# Patient Record
Sex: Female | Born: 1950 | Race: White | Hispanic: No | Marital: Married | Smoking: Never smoker
Health system: Southern US, Community
[De-identification: ages and names within clinical notes are randomized; demographics above are authoritative.]

## PROBLEM LIST (undated history)

## (undated) DIAGNOSIS — G35 Multiple sclerosis: Secondary | ICD-10-CM

## (undated) DIAGNOSIS — D332 Benign neoplasm of brain, unspecified: Secondary | ICD-10-CM

## (undated) DIAGNOSIS — H539 Unspecified visual disturbance: Secondary | ICD-10-CM

## (undated) DIAGNOSIS — I10 Essential (primary) hypertension: Secondary | ICD-10-CM

## (undated) DIAGNOSIS — H9190 Unspecified hearing loss, unspecified ear: Secondary | ICD-10-CM

## (undated) DIAGNOSIS — J329 Chronic sinusitis, unspecified: Secondary | ICD-10-CM

## (undated) DIAGNOSIS — I7300 Raynaud's syndrome without gangrene: Secondary | ICD-10-CM

## (undated) HISTORY — DX: Unspecified hearing loss, unspecified ear: H91.90

## (undated) HISTORY — DX: Unspecified visual disturbance: H53.9

## (undated) HISTORY — PX: BRAIN TUMOR EXCISION: SHX577

## (undated) HISTORY — DX: Multiple sclerosis: G35

## (undated) HISTORY — PX: OTHER SURGICAL HISTORY: SHX169

## (undated) HISTORY — PX: NASAL SINUS SURGERY: SHX719

## (undated) HISTORY — DX: Essential (primary) hypertension: I10

---

## 1998-08-20 ENCOUNTER — Ambulatory Visit (HOSPITAL_COMMUNITY): Admission: RE | Admit: 1998-08-20 | Discharge: 1998-08-20 | Payer: Self-pay | Admitting: Internal Medicine

## 1998-08-20 ENCOUNTER — Encounter: Payer: Self-pay | Admitting: Internal Medicine

## 1998-10-07 ENCOUNTER — Ambulatory Visit: Admission: RE | Admit: 1998-10-07 | Discharge: 1998-10-07 | Payer: Self-pay | Admitting: Internal Medicine

## 1999-06-29 ENCOUNTER — Other Ambulatory Visit: Admission: RE | Admit: 1999-06-29 | Discharge: 1999-06-29 | Payer: Self-pay | Admitting: Internal Medicine

## 2000-05-05 ENCOUNTER — Other Ambulatory Visit: Admission: RE | Admit: 2000-05-05 | Discharge: 2000-05-05 | Payer: Self-pay | Admitting: Internal Medicine

## 2000-06-08 ENCOUNTER — Other Ambulatory Visit: Admission: RE | Admit: 2000-06-08 | Discharge: 2000-06-08 | Payer: Self-pay | Admitting: Internal Medicine

## 2000-12-07 ENCOUNTER — Encounter: Payer: Self-pay | Admitting: Internal Medicine

## 2000-12-07 ENCOUNTER — Encounter: Admission: RE | Admit: 2000-12-07 | Discharge: 2000-12-07 | Payer: Self-pay | Admitting: Internal Medicine

## 2001-05-25 ENCOUNTER — Other Ambulatory Visit: Admission: RE | Admit: 2001-05-25 | Discharge: 2001-05-25 | Payer: Self-pay | Admitting: Internal Medicine

## 2001-06-07 ENCOUNTER — Encounter: Payer: Self-pay | Admitting: Internal Medicine

## 2001-06-07 ENCOUNTER — Encounter: Admission: RE | Admit: 2001-06-07 | Discharge: 2001-06-07 | Payer: Self-pay | Admitting: Internal Medicine

## 2001-06-22 ENCOUNTER — Encounter: Admission: RE | Admit: 2001-06-22 | Discharge: 2001-06-22 | Payer: Self-pay | Admitting: Internal Medicine

## 2001-06-22 ENCOUNTER — Encounter: Payer: Self-pay | Admitting: Internal Medicine

## 2002-05-31 ENCOUNTER — Other Ambulatory Visit: Admission: RE | Admit: 2002-05-31 | Discharge: 2002-05-31 | Payer: Self-pay | Admitting: Internal Medicine

## 2003-06-21 ENCOUNTER — Other Ambulatory Visit: Admission: RE | Admit: 2003-06-21 | Discharge: 2003-06-21 | Payer: Self-pay | Admitting: Internal Medicine

## 2004-02-28 ENCOUNTER — Encounter: Admission: RE | Admit: 2004-02-28 | Discharge: 2004-02-28 | Payer: Self-pay | Admitting: Internal Medicine

## 2004-03-03 ENCOUNTER — Ambulatory Visit (HOSPITAL_COMMUNITY): Admission: RE | Admit: 2004-03-03 | Discharge: 2004-03-03 | Payer: Self-pay | Admitting: Internal Medicine

## 2005-06-16 IMAGING — CT CT ABDOMEN W/ CM
1 series · 15 of 32 positions shown, 19 images · IV contrast (agent unspecified)
Comparison: none

CLINICAL DATA: Right lower quadrant pain for three weeks. Nausea. 
 ABDOMINAL CT WITH CONTRAST

[Series 2: routine abdomen · axial · 0.70mm/px · z∈[-377,+3]mm · 15 of 112 slices shown, 19 images]
[im 8/112  soft-tissue]
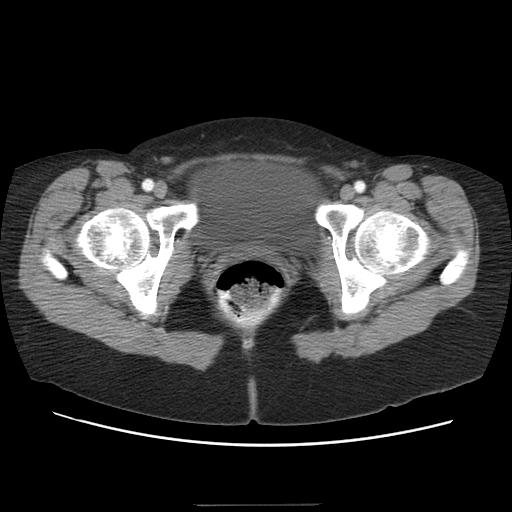
[im 8/112  bone]
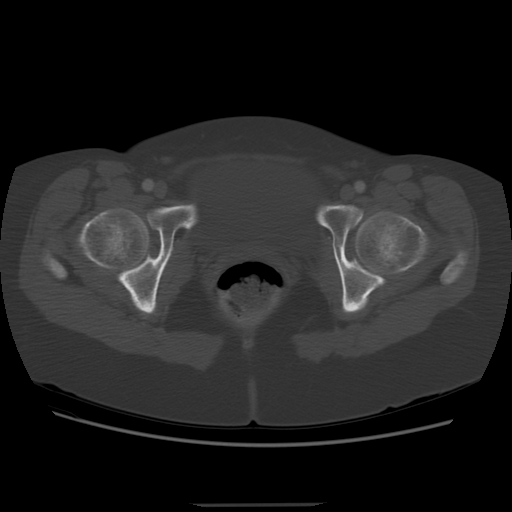
[im 15/112  soft-tissue]
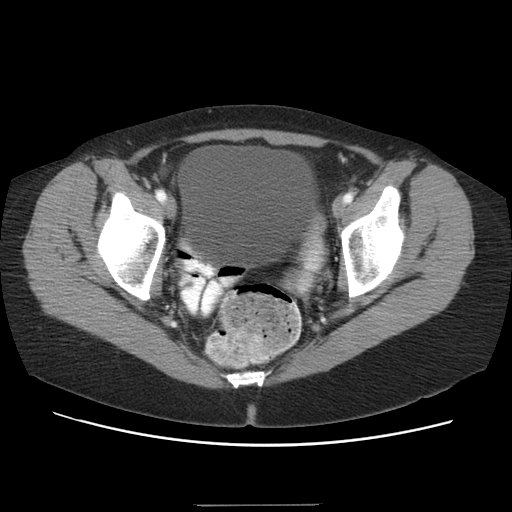
[im 22/112  soft-tissue]
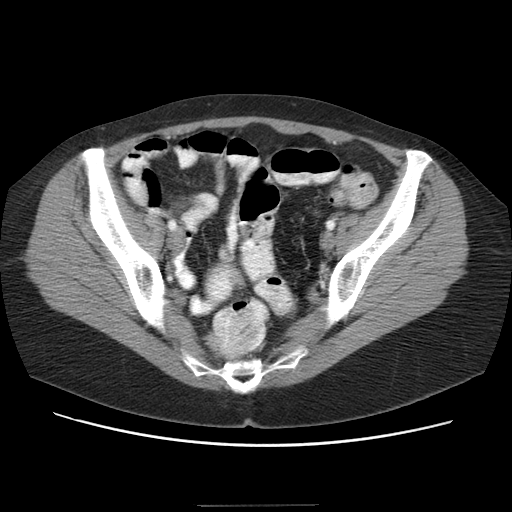
[im 33/112  soft-tissue]
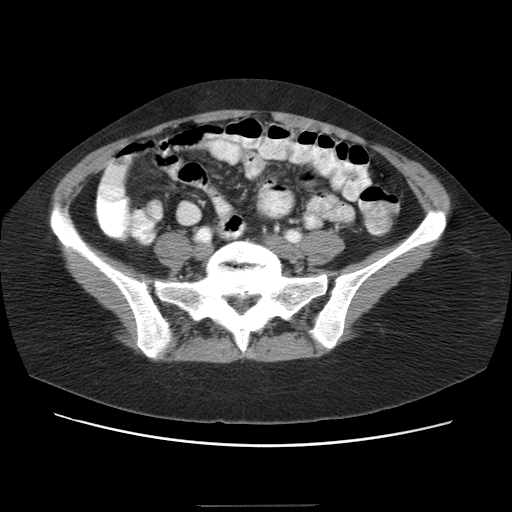
[im 40/112  soft-tissue]
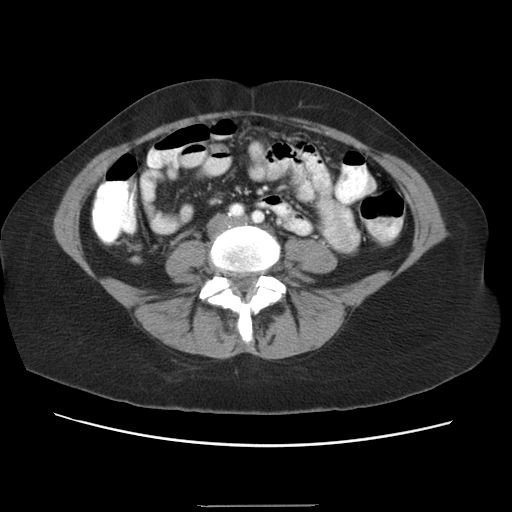
[im 47/112  soft-tissue]
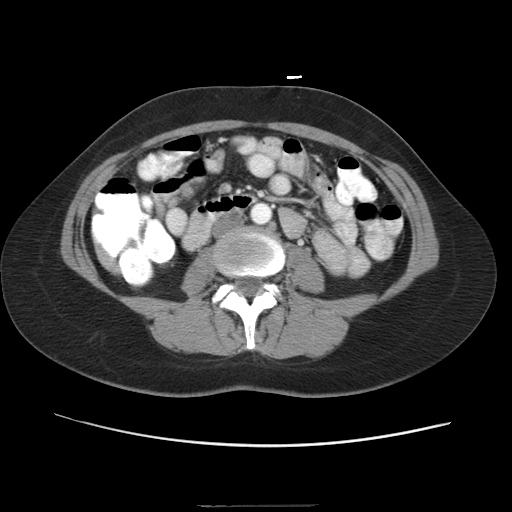
[im 58/112  soft-tissue]
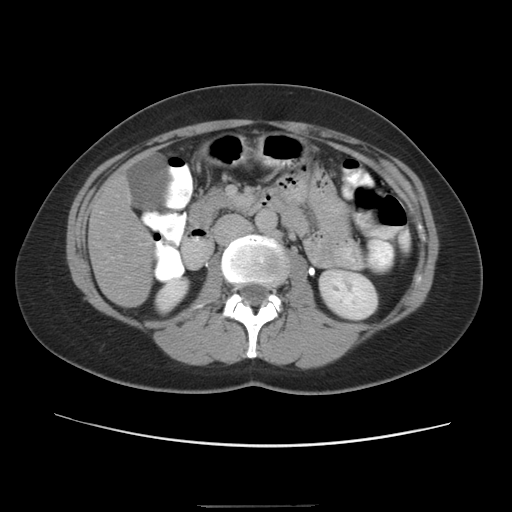
[im 65/112  soft-tissue]
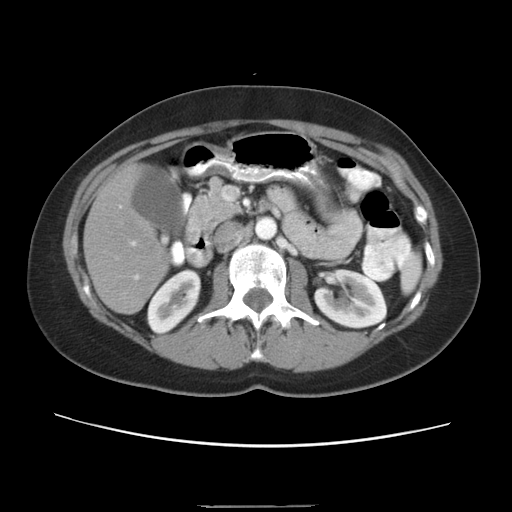
[im 72/112  soft-tissue]
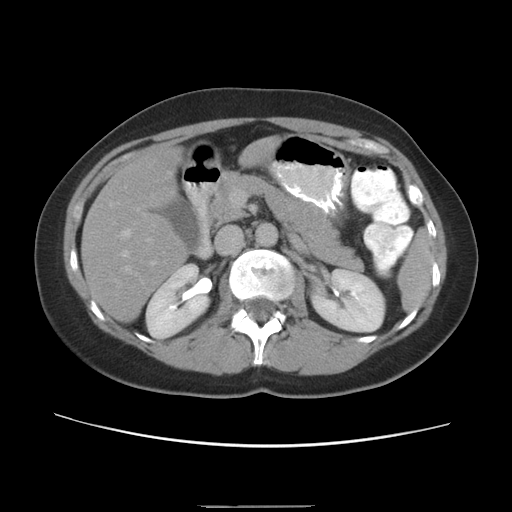
[im 72/112  bone]
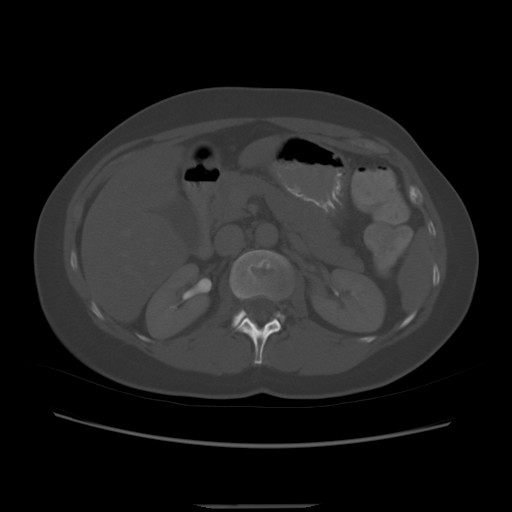
[im 79/112  soft-tissue]
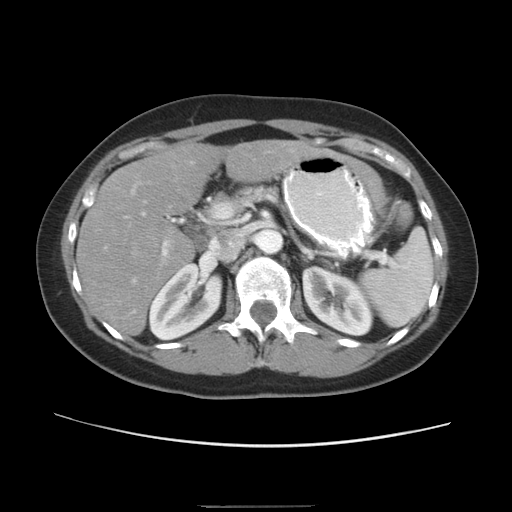
[im 90/112  soft-tissue]
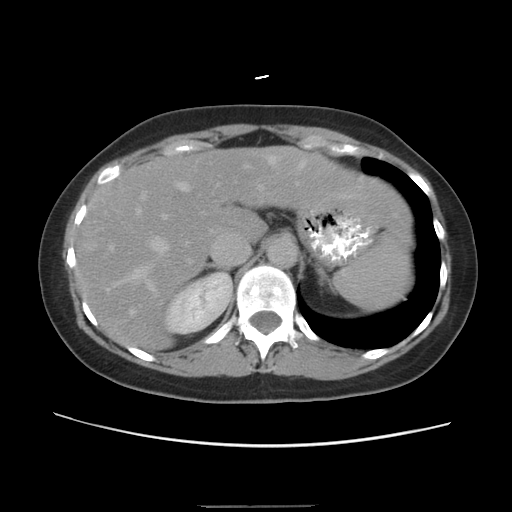
[im 97/112  soft-tissue]
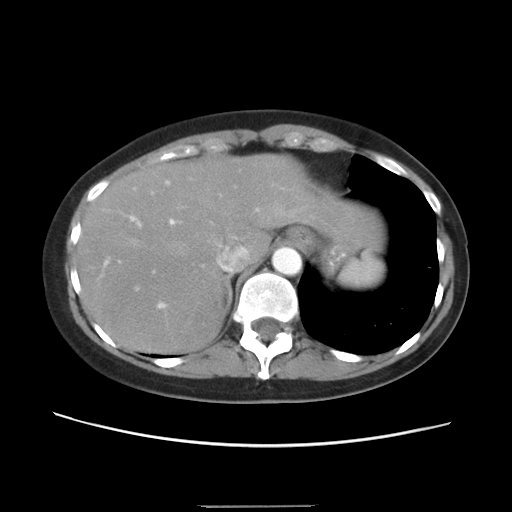
[im 97/112  lung]
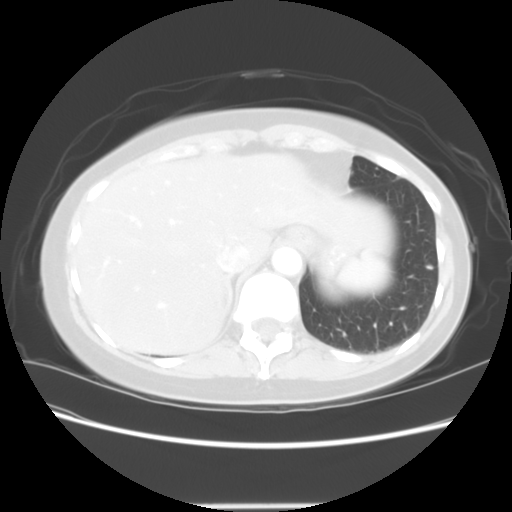
[im 101/112  lung]
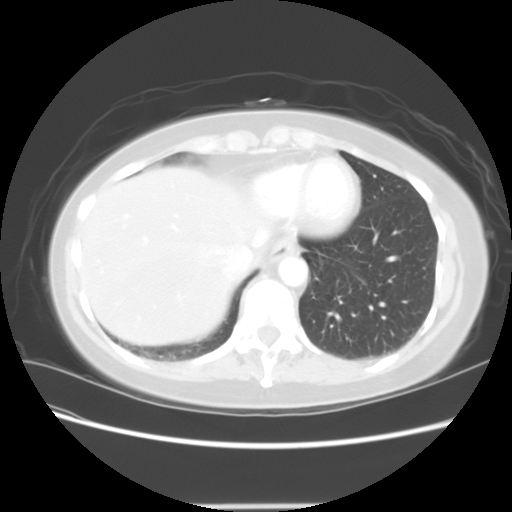
[im 104/112  soft-tissue]
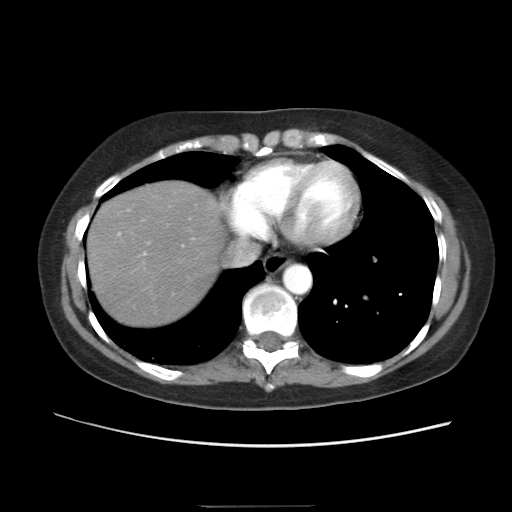
[im 104/112  lung]
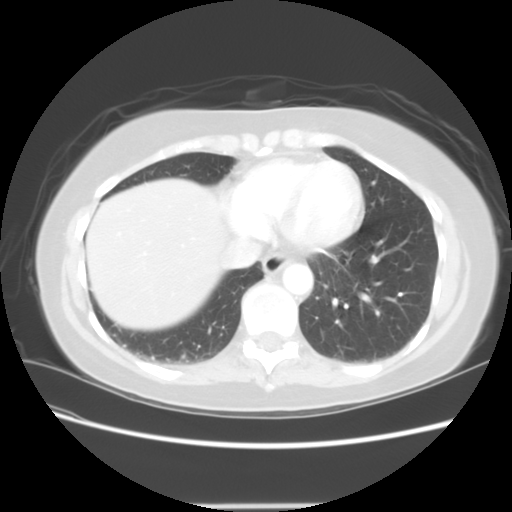
[im 108/112  lung]
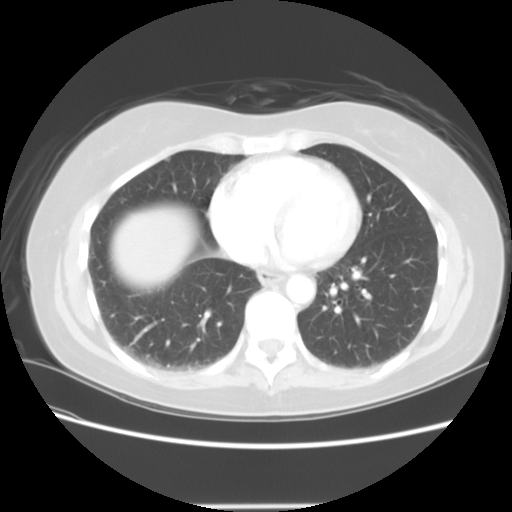

[15 of 32 positions shown; findings below may reference images not displayed]

Routine spiral CT of the abdomen was performed.   100 cc Omnipaque 300 intravenous contrast and oral contrast were administered.

 The abdominal parenchymal organs are normal in appearance. There is no evidence of masses, adenopathy, inflammatory process, or abnormal fluid collections.  Moderate degenerative disc disease is seen at L5-S1.  Slight diffuse fatty infiltration of the liver.   Anatomic variant left retroaortic renal vein is seen.  The retrocecal appendix appears normal in caliber with no periappendiceal inflammation measuring 5 mm (image 48).

 IMPRESSION
 1.  Moderate degenerative disc disease L5-S1.
 2.  Slight diffuse fatty infiltration of the liver. 
 3.  Otherwise negative.

 PELVIS CT WITH CONTRAST
 Routine spiral CT of the pelvis was performed. Omnipaque intravenous contrast and oral contrast were administered. 

 The pelvic structures are normal in appearance. There is no evidence of masses, adenopathy, inflammatory process, or abnormal fluid collections.   Patient is post-hysterectomy with left oophorectomy for endometriosis.

 IMPRESSION
 Normal postoperative study.

## 2005-07-26 ENCOUNTER — Other Ambulatory Visit: Admission: RE | Admit: 2005-07-26 | Discharge: 2005-07-26 | Payer: Self-pay | Admitting: Internal Medicine

## 2005-10-14 ENCOUNTER — Ambulatory Visit (HOSPITAL_COMMUNITY): Admission: RE | Admit: 2005-10-14 | Discharge: 2005-10-14 | Payer: Self-pay | Admitting: Internal Medicine

## 2010-09-20 ENCOUNTER — Encounter: Payer: Self-pay | Admitting: Internal Medicine

## 2011-06-02 DIAGNOSIS — G35 Multiple sclerosis: Secondary | ICD-10-CM | POA: Insufficient documentation

## 2011-06-02 DIAGNOSIS — H918X9 Other specified hearing loss, unspecified ear: Secondary | ICD-10-CM | POA: Insufficient documentation

## 2011-06-02 DIAGNOSIS — H9319 Tinnitus, unspecified ear: Secondary | ICD-10-CM | POA: Insufficient documentation

## 2011-07-19 DIAGNOSIS — R231 Pallor: Secondary | ICD-10-CM | POA: Insufficient documentation

## 2011-07-19 DIAGNOSIS — D1801 Hemangioma of skin and subcutaneous tissue: Secondary | ICD-10-CM | POA: Insufficient documentation

## 2011-07-19 DIAGNOSIS — D229 Melanocytic nevi, unspecified: Secondary | ICD-10-CM | POA: Insufficient documentation

## 2011-10-11 DIAGNOSIS — I10 Essential (primary) hypertension: Secondary | ICD-10-CM | POA: Insufficient documentation

## 2011-10-11 DIAGNOSIS — R079 Chest pain, unspecified: Secondary | ICD-10-CM | POA: Insufficient documentation

## 2011-10-11 DIAGNOSIS — R1900 Intra-abdominal and pelvic swelling, mass and lump, unspecified site: Secondary | ICD-10-CM | POA: Insufficient documentation

## 2012-03-14 DIAGNOSIS — J0130 Acute sphenoidal sinusitis, unspecified: Secondary | ICD-10-CM | POA: Insufficient documentation

## 2013-06-18 ENCOUNTER — Telehealth: Payer: Self-pay | Admitting: Hematology & Oncology

## 2013-06-26 NOTE — Telephone Encounter (Signed)
na

## 2014-10-11 ENCOUNTER — Other Ambulatory Visit: Payer: Self-pay | Admitting: Neurology

## 2014-10-11 NOTE — Telephone Encounter (Signed)
Patient has not been seen at [facility].  Per Faith, we cannot refill meds until patient is seen here.

## 2014-10-20 ENCOUNTER — Encounter: Payer: Self-pay | Admitting: Neurology

## 2014-10-20 DIAGNOSIS — D329 Benign neoplasm of meninges, unspecified: Secondary | ICD-10-CM | POA: Insufficient documentation

## 2014-10-20 DIAGNOSIS — F418 Other specified anxiety disorders: Secondary | ICD-10-CM | POA: Insufficient documentation

## 2014-10-20 DIAGNOSIS — R260 Ataxic gait: Secondary | ICD-10-CM | POA: Insufficient documentation

## 2014-10-20 DIAGNOSIS — R413 Other amnesia: Secondary | ICD-10-CM | POA: Insufficient documentation

## 2014-10-20 DIAGNOSIS — R5383 Other fatigue: Secondary | ICD-10-CM | POA: Insufficient documentation

## 2014-10-20 DIAGNOSIS — R3911 Hesitancy of micturition: Secondary | ICD-10-CM | POA: Insufficient documentation

## 2014-10-20 DIAGNOSIS — G35A Relapsing-remitting multiple sclerosis: Secondary | ICD-10-CM | POA: Insufficient documentation

## 2014-10-20 DIAGNOSIS — G35 Multiple sclerosis: Secondary | ICD-10-CM | POA: Insufficient documentation

## 2014-10-21 ENCOUNTER — Encounter: Payer: Self-pay | Admitting: Neurology

## 2014-10-21 ENCOUNTER — Ambulatory Visit (INDEPENDENT_AMBULATORY_CARE_PROVIDER_SITE_OTHER): Payer: BLUE CROSS/BLUE SHIELD | Admitting: Neurology

## 2014-10-21 VITALS — BP 124/68 | HR 70 | Resp 12 | Ht 66.0 in | Wt 141.0 lb

## 2014-10-21 DIAGNOSIS — I7300 Raynaud's syndrome without gangrene: Secondary | ICD-10-CM | POA: Insufficient documentation

## 2014-10-21 DIAGNOSIS — R260 Ataxic gait: Secondary | ICD-10-CM

## 2014-10-21 DIAGNOSIS — G35 Multiple sclerosis: Secondary | ICD-10-CM | POA: Insufficient documentation

## 2014-10-21 DIAGNOSIS — F418 Other specified anxiety disorders: Secondary | ICD-10-CM

## 2014-10-21 DIAGNOSIS — D329 Benign neoplasm of meninges, unspecified: Secondary | ICD-10-CM

## 2014-10-21 DIAGNOSIS — I10 Essential (primary) hypertension: Secondary | ICD-10-CM | POA: Insufficient documentation

## 2014-10-21 DIAGNOSIS — R5383 Other fatigue: Secondary | ICD-10-CM | POA: Diagnosis not present

## 2014-10-21 DIAGNOSIS — J329 Chronic sinusitis, unspecified: Secondary | ICD-10-CM | POA: Insufficient documentation

## 2014-10-21 DIAGNOSIS — J45909 Unspecified asthma, uncomplicated: Secondary | ICD-10-CM | POA: Insufficient documentation

## 2014-10-21 DIAGNOSIS — K760 Fatty (change of) liver, not elsewhere classified: Secondary | ICD-10-CM | POA: Insufficient documentation

## 2014-10-21 DIAGNOSIS — M8580 Other specified disorders of bone density and structure, unspecified site: Secondary | ICD-10-CM | POA: Insufficient documentation

## 2014-10-21 DIAGNOSIS — R413 Other amnesia: Secondary | ICD-10-CM

## 2014-10-21 MED ORDER — BACLOFEN 20 MG PO TABS: 20.0000 mg | ORAL_TABLET | Freq: Two times a day (BID) | ORAL | Status: DC

## 2014-10-21 MED ORDER — CARISOPRODOL 350 MG PO TABS: 350.0000 mg | ORAL_TABLET | Freq: Every day | ORAL | Status: DC | PRN

## 2014-10-21 MED ORDER — BUPROPION HCL ER (XL) 150 MG PO TB24: 150.0000 mg | ORAL_TABLET | Freq: Every day | ORAL | Status: DC

## 2014-10-21 MED ORDER — ALPRAZOLAM 0.5 MG PO TABS: 0.5000 mg | ORAL_TABLET | Freq: Every evening | ORAL | Status: DC | PRN

## 2014-10-21 MED ORDER — GABAPENTIN 600 MG PO TABS: 600.0000 mg | ORAL_TABLET | Freq: Three times a day (TID) | ORAL | Status: DC

## 2014-10-21 NOTE — Progress Notes (Signed)
[facility]  PATIENT: Dorothy Meyer DOB: 1951/02/08  REFERRING DOCTOR OR PCP:  Scarlette Slice  SOURCE: Patient  _________________________________   HISTORICAL  CHIEF COMPLAINT:  Chief Complaint  Patient presents with  . Multiple Sclerosis    Sts. she tolerates Rebif well.  Denies new or worsening sx./fim    HISTORY OF PRESENT ILLNESS:  Dorothy Meyer is a 64 year old woman who was diagnosed with multiple sclerosis in the 1997.     She presented with numbness in both arms. An MRI of the cervical spine showed a plaque at C2C3.   An MRI of the brain at that time showed other lesions, compatible with the diagnosis of multiple sclerosis. She had only 3 plaques in the brain initially and treatment was deferred. However, one year later she had optic neuritis, confirmed the diagnosis of multiple sclerosis. Initially, she was placed on Avonex. She then tried Copaxonelater on as she was having flulike symptoms with Avonex. However, she had severe skin reactions with Copaxone that was stopped. She then went on Rebif. She has been on Rebif for the past 8 years or so and tolerates it very well. Her MS has been mostly stable while on the Rebif. However, she has had a couple episodes of mild neurologic symptoms that may have represented small exacerbations that were treated with IV steroids.   The last episode was 08/01/2014 for eye pain and visual blurring.   He received 2 days of IV Solu-Medrol with benefit. Further symptoms since then.   Her Rebif dose is 22 g. She tolerates that dose well. Her last MRI of the brain was in 2015 and did not show any new lesions.  I personally reviewed her last brain and cervical spine MRI. On the brain MRI she has multiple lesions consistent with MS in the periventricular, deep and subcortical white matter. 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 On the cervical spine MRI she has 3 hyperintense  foci consistent with demyelinating plaque.  She has many symptoms related to her MS including poor gait, history of optic neuritis, urinary hesitancy, fatigue, mood dysfunction and cognitive concerns.  Gait:  She notes a little weakness in the right leg. This leads to a right foot drop which affects her gait. She does not need to use a cane. This is been stable for many years without clear worsening since I started to see her.  Vision:   In the past, she has had right optic neuritis. This was proceeded by a fair amount of pain that eye. She has had a couple episodes of eye pain that improved with IV steroids. She has not had any definite optic neuritis, however since the initial episode.  No diplopia  Bladder:   She denies any significant urinary frequency, urgency or hesitancy.  Fatigue:  She notes physical more than cognitive fatigue. This does not occur every day. This  can be triggered by stress or by heat. She has tried several medications including Provigil and Ritalin in the past. However they did not help her fatigue significantly.  Mood dysfunction:   She currently feels that there is no significant depression or anxiety. She remains on Wellbutrin. She was having some irritability in the past but feels that this is better.  Cognitive dysfunction:  She notes some difficulty with focus, attention, verbal fluency and multitasking. She stopped working 2 years ago. She feels she is able to do better with the mild  cognitive dysfunction with current activities than when she was working.  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  She sees at [facility] for this.   REVIEW OF SYSTEMS: Constitutional: No fevers, chills, sweats, or change in appetite.   Fatigue is stable Eyes: No visual changes, double vision, eye pain Ear, nose and throat: No hearing loss, ear pain, nasal congestion, sore throat Cardiovascular: No chest  pain, palpitations Respiratory: No shortness of breath at rest or with exertion.   No wheezes GastrointestinaI: No nausea, vomiting, diarrhea, abdominal pain, fecal incontinence Genitourinary: No dysuria, urinary retention or frequency.  No nocturia. Musculoskeletal: No neck pain, back pain Integumentary: No rash, pruritus, skin lesions Neurological: as above Psychiatric: No current depression at this time.  No anxiety Endocrine: No palpitations, diaphoresis, change in appetite, change in weigh or increased thirst Hematologic/Lymphatic: No anemia, purpura, petechiae. Allergic/Immunologic: No itchy/runny eyes, nasal congestion, recent allergic reactions, rashes  ALLERGIES: Allergies  Allergen Reactions  . Hydrocodone-Acetaminophen Nausea And Vomiting  . Propoxyphene Nausea And Vomiting  . Tramadol Nausea And Vomiting    HOME MEDICATIONS:  Current outpatient prescriptions:  .  fluconazole (DIFLUCAN) 150 MG tablet, Take 1 tablet once; may repeat in 3 days if needed., Disp: , Rfl:  .  lisinopril (PRINIVIL,ZESTRIL) 10 MG tablet, TAKE 1 TABLET (10 MG TOTAL) BY MOUTH DAILY., Disp: , Rfl:  .  metroNIDAZOLE (METROGEL) 1 % gel, Apply topically., Disp: , Rfl:  .  albuterol (PROVENTIL HFA;VENTOLIN HFA) 108 (90 BASE) MCG/ACT inhaler, Inhale into the lungs., Disp: , Rfl:  .  ALPRAZolam (XANAX) 0.5 MG tablet, Take 0.5 mg by mouth., Disp: , Rfl:  .  baclofen (LIORESAL) 20 MG tablet, Take 20 mg by mouth., Disp: , Rfl:  .  budesonide (PULMICORT) 1 MG/2ML nebulizer solution, , Disp: , Rfl: 10 .  buPROPion (WELLBUTRIN SR) 150 MG 12 hr tablet, Take 150 mg by mouth., Disp: , Rfl:  .  calcium-vitamin D (OSCAL) 250-125 MG-UNIT per tablet, Take by mouth., Disp: , Rfl:  .  carisoprodol (SOMA) 350 MG tablet, Take 350 mg by mouth., Disp: , Rfl:  .  conjugated estrogens (PREMARIN) vaginal cream, Place vaginally., Disp: , Rfl:  .  gabapentin (NEURONTIN) 600 MG tablet, Take 600 mg by mouth., Disp: , Rfl:  .   interferon beta-1a (REBIF) 44 MCG/0.5ML SOSY injection, Inject 22 mcg into the skin., Disp: , Rfl:  .  Multiple Vitamin (THERA) TABS, Take 1 tablet by mouth., Disp: , Rfl:  .  Omega-3 Fatty Acids (FISH OIL) 500 MG CAPS, Take by mouth., Disp: , Rfl:   PAST MEDICAL HISTORY: Past Medical History  Diagnosis Date  . Hearing loss   . Hypertension   . Multiple sclerosis   . Vision abnormalities     PAST SURGICAL HISTORY: Past Surgical History  Procedure Laterality Date  . Brain tumor excision    . Hystrectomy      FAMILY HISTORY: Family History  Problem Relation Age of Onset  . Healthy Mother   . Lung cancer Father     SOCIAL HISTORY:  History   Social History  . Marital Status: Married    Spouse Name: N/A  . Number of Children: N/A  . Years of Education: N/A   Occupational History  . Not on file.   Social History Main Topics  . Smoking status: Never Smoker   . Smokeless tobacco: Not on file  . Alcohol Use:  No  . Drug Use: No  . Sexual Activity: Not on file   Other Topics Concern  . Not on file   Social History Narrative  . No narrative on file     PHYSICAL EXAM  Filed Vitals:   10/21/14 1554  BP: 124/68  Pulse: 70  Resp: 12  Height: 5\' 6"  (1.676 m)  Weight: 141 lb (63.957 kg)    Body mass index is 22.77 kg/(m^2).   General: The patient is well-developed and well-nourished and in no acute distress  Eyes:  Funduscopic exam shows normal optic discs and retinal vessels.  Neck: The neck is supple, no carotid bruits are noted.  The neck is nontender.  Cardiovascular: The heart has a regular rate and rhythm with a normal S1 and S2. There were no murmurs, gallops or rubs. Lungs are clear to auscultation.  Skin: Extremities are without significant edema.  Musculoskeletal:  Back is nontender  Neurologic Exam  Mental status: The patient is alert and oriented x 3 at the time of the examination. The patient has apparent normal recent and remote memory,  with an apparently normal attention span and concentration ability.   Speech is normal.  Cranial nerves: Extraocular movements are full. Pupils are equal, round, and reactive to light and accomodation.  Visual fields are full.  Facial symmetry is present. There is good facial sensation to soft touch bilaterally.Facial strength is normal.  Trapezius and sternocleidomastoid strength is normal. No dysarthria is noted.  The tongue is midline, and the patient has symmetric elevation of the soft palate. No obvious hearing deficits are noted.  Motor:  Muscle bulk is normal.   Tone is normal. Strength is  5 / 5 in all 4 extremities.   Sensory: Sensory testing is intact to pinprick, soft touch and vibration sensation in all 4 extremities.  Coordination: Cerebellar testing reveals good finger-nose-finger and slightly reduced heel-to-shin bilaterally.  Gait and station: Station is normal.   Gait is minimally wide with a mild right foot drop. Tandem gait is mildly wide. Romberg is negative.   Reflexes: Deep tendon reflexes are symmetric and normal bilaterally.   Plantar responses are flexor.    DIAGNOSTIC DATA (LABS, IMAGING, TESTING) - I reviewed patient records, labs, notes, testing and imaging myself where available.   About 1 pages of notes from [facility] neurology were reviewed including MRI reports and lab reports.       ASSESSMENT AND PLAN  Multiple sclerosis, relapsing-remitting  Other fatigue  Memory loss due to medical condition  Depression with anxiety  Ataxic gait  Meningioma   In summary, Dorothy Meyer is a 64 year old woman with a 19 year history of multiple sclerosis who is currently doing well on Rebif therapy. She continues to have some symptoms associated with her MS including a mildly reduced gait, fatigue and cognitive dysfunction. The fatigue and cognitive dysfunction have less effect on her life now that she is not working. I will refill her medications. She is advised  to continue to remain active  She will return to see me in 6 months or sooner if she has new or worsening neurologic symptoms.  40 minute face-to-face interaction with greater than 50% of the time counseling and correlating care about her multiple sclerosis and related symptoms.   Richard A. Felecia Shelling, MD, PhD 02/17/3085, 5:78 PM Certified in Neurology, Clinical Neurophysiology, Sleep Medicine, Pain Medicine and Neuroimaging  [facility] [address], [place], [place] [address] [phone]

## 2014-11-18 ENCOUNTER — Emergency Department (HOSPITAL_COMMUNITY)
Admission: EM | Admit: 2014-11-18 | Discharge: 2014-11-18 | Disposition: A | Discharge status: Home / Self Care | Payer: BLUE CROSS/BLUE SHIELD | Attending: Emergency Medicine | Admitting: Emergency Medicine

## 2014-11-18 ENCOUNTER — Emergency Department (HOSPITAL_COMMUNITY): Payer: BLUE CROSS/BLUE SHIELD

## 2014-11-18 ENCOUNTER — Encounter (HOSPITAL_COMMUNITY): Payer: Self-pay

## 2014-11-18 DIAGNOSIS — R51 Headache: Secondary | ICD-10-CM | POA: Diagnosis not present

## 2014-11-18 DIAGNOSIS — R42 Dizziness and giddiness: Secondary | ICD-10-CM | POA: Insufficient documentation

## 2014-11-18 DIAGNOSIS — Z8709 Personal history of other diseases of the respiratory system: Secondary | ICD-10-CM | POA: Insufficient documentation

## 2014-11-18 DIAGNOSIS — H9190 Unspecified hearing loss, unspecified ear: Secondary | ICD-10-CM | POA: Insufficient documentation

## 2014-11-18 DIAGNOSIS — I10 Essential (primary) hypertension: Secondary | ICD-10-CM | POA: Diagnosis not present

## 2014-11-18 DIAGNOSIS — Z79899 Other long term (current) drug therapy: Secondary | ICD-10-CM | POA: Insufficient documentation

## 2014-11-18 DIAGNOSIS — Z86011 Personal history of benign neoplasm of the brain: Secondary | ICD-10-CM | POA: Insufficient documentation

## 2014-11-18 HISTORY — DX: Raynaud's syndrome without gangrene: I73.00

## 2014-11-18 HISTORY — DX: Chronic sinusitis, unspecified: J32.9

## 2014-11-18 HISTORY — DX: Benign neoplasm of brain, unspecified: D33.2

## 2014-11-18 LAB — BASIC METABOLIC PANEL
ANION GAP: 12 (ref 5–15)
BUN: 14 mg/dL (ref 6–23)
CALCIUM: 9.6 mg/dL (ref 8.4–10.5)
CO2: 26 mmol/L (ref 19–32)
Chloride: 102 mmol/L (ref 96–112)
Creatinine, Ser: 0.78 mg/dL (ref 0.50–1.10)
GFR, EST NON AFRICAN AMERICAN: 87 mL/min — AB (ref >90)
GLUCOSE: 106 mg/dL — AB (ref 70–99)
POTASSIUM: 4 mmol/L (ref 3.5–5.1)
SODIUM: 140 mmol/L (ref 135–145)

## 2014-11-18 LAB — CBC WITH DIFFERENTIAL/PLATELET
Basophils Absolute: 0 10*3/uL (ref 0.0–0.1)
Basophils Relative: 0 % (ref 0–1)
EOS ABS: 0.1 10*3/uL (ref 0.0–0.7)
EOS PCT: 1 % (ref 0–5)
HEMATOCRIT: 43.8 % (ref 36.0–46.0)
Hemoglobin: 14.1 g/dL (ref 12.0–15.0)
LYMPHS ABS: 1.1 10*3/uL (ref 0.7–4.0)
LYMPHS PCT: 12 % (ref 12–46)
MCH: 30.5 pg (ref 26.0–34.0)
MCHC: 32.2 g/dL (ref 30.0–36.0)
MCV: 94.6 fL (ref 78.0–100.0)
MONO ABS: 0.4 10*3/uL (ref 0.1–1.0)
Monocytes Relative: 5 % (ref 3–12)
Neutro Abs: 7.6 10*3/uL (ref 1.7–7.7)
Neutrophils Relative %: 82 % — ABNORMAL HIGH (ref 43–77)
PLATELETS: 268 10*3/uL (ref 150–400)
RBC: 4.63 MIL/uL (ref 3.87–5.11)
RDW: 13.1 % (ref 11.5–15.5)
WBC: 9.2 10*3/uL (ref 4.0–10.5)

## 2014-11-18 LAB — I-STAT TROPONIN, ED: Troponin i, poc: 0 ng/mL (ref 0.00–0.08)

## 2014-11-18 MED ORDER — ONDANSETRON 4 MG PO TBDP: ORAL_TABLET | ORAL | Status: DC

## 2014-11-18 MED ORDER — MECLIZINE HCL 25 MG PO TABS: 25.0000 mg | ORAL_TABLET | Freq: Four times a day (QID) | ORAL | Status: DC

## 2014-11-18 MED ORDER — SODIUM CHLORIDE 0.9 % IV BOLUS (SEPSIS)
1000.0000 mL | INTRAVENOUS | Status: AC
Start: 2014-11-18 — End: 2014-11-18
  Administered 2014-11-18: 1000 mL via INTRAVENOUS

## 2014-11-18 MED ORDER — DIPHENHYDRAMINE HCL 50 MG/ML IJ SOLN
25.0000 mg | Freq: Once | INTRAMUSCULAR | Status: AC
  Administered 2014-11-18: 25 mg via INTRAVENOUS
  Filled 2014-11-18: qty 1

## 2014-11-18 MED ORDER — METOCLOPRAMIDE HCL 5 MG/ML IJ SOLN
5.0000 mg | Freq: Once | INTRAMUSCULAR | Status: AC
  Administered 2014-11-18: 5 mg via INTRAVENOUS
  Filled 2014-11-18: qty 2

## 2014-11-18 NOTE — ED Notes (Signed)
Per EMS- Patient has been experiencing intermittent dizziness with N/V x 6 months. Today, patient was at the dentis and began having severe dizziness with nausea. Patient states today was the worst episode thus far. EMS gave Zofran 4 mg IV prior to arrival to the ED.

## 2014-11-18 NOTE — ED Provider Notes (Signed)
CSN: [id number]     Arrival date & time 11/18/14  1132 History   First MD Initiated Contact with Patient 11/18/14 1228     No chief complaint on file.    (Consider location/radiation/quality/duration/timing/severity/associated sxs/prior Treatment) Patient is a 64 y.o. female presenting with dizziness. The history is provided by the patient.  Dizziness Quality:  Vertigo Severity:  Moderate Onset quality:  Sudden Duration:  3 hours Timing:  Constant Progression:  Improving Chronicity:  Recurrent Context: head movement   Relieved by:  Nothing Worsened by:  Nothing Ineffective treatments:  None tried Associated symptoms: headaches   Associated symptoms: no chest pain, no diarrhea, no nausea, no shortness of breath and no vomiting   Headaches:    Severity:  Mild   Onset quality:  Sudden   Duration:  3 hours   Timing:  Constant   Progression:  Unchanged   Chronicity:  New   Past Medical History  Diagnosis Date  . Hearing loss   . Hypertension   . Multiple sclerosis   . Vision abnormalities   . Raynaud disease   . Chronic sinusitis   . Benign brain tumor    Past Surgical History  Procedure Laterality Date  . Brain tumor excision    . Hystrectomy    . Nasal sinus surgery     Family History  Problem Relation Age of Onset  . Healthy Mother   . Lung cancer Father    History  Substance Use Topics  . Smoking status: Never Smoker   . Smokeless tobacco: Never Used  . Alcohol Use: No   OB History    No data available     Review of Systems  Constitutional: Negative for fever and fatigue.  HENT: Negative for congestion and drooling.   Eyes: Negative for pain.  Respiratory: Negative for cough and shortness of breath.   Cardiovascular: Negative for chest pain.  Gastrointestinal: Negative for nausea, vomiting, abdominal pain and diarrhea.  Genitourinary: Negative for dysuria and hematuria.  Musculoskeletal: Negative for back pain, gait problem and neck pain.  Skin:  Negative for color change.  Neurological: Positive for dizziness and headaches.  Hematological: Negative for adenopathy.  Psychiatric/Behavioral: Negative for behavioral problems.  All other systems reviewed and are negative.     Allergies  Hydrocodone-acetaminophen; Propoxyphene; and Tramadol  Home Medications   Prior to Admission medications   Medication Sig Start Date End Date Taking? Authorizing Provider  albuterol (PROVENTIL HFA;VENTOLIN HFA) 108 (90 BASE) MCG/ACT inhaler Inhale 2 puffs into the lungs every 4 (four) hours as needed for wheezing or shortness of breath.    Yes Historical Provider, MD  ALPRAZolam Duanne Moron) 0.5 MG tablet Take 1 tablet (0.5 mg total) by mouth at bedtime as needed for anxiety. 10/21/14  Yes Britt Bottom, MD  baclofen (LIORESAL) 20 MG tablet Take 1 tablet (20 mg total) by mouth 2 (two) times daily. 10/21/14  Yes Britt Bottom, MD  budesonide (PULMICORT) 1 MG/2ML nebulizer solution Take 1 mg by nebulization daily as needed (shortness of breath).  10/11/14  Yes Historical Provider, MD  buPROPion (WELLBUTRIN XL) 150 MG 24 hr tablet Take 1 tablet (150 mg total) by mouth daily. 10/21/14  Yes Britt Bottom, MD  calcium-vitamin D (OSCAL) 250-125 MG-UNIT per tablet Take 1 tablet by mouth daily.    Yes Historical Provider, MD  carisoprodol (SOMA) 350 MG tablet Take 1 tablet (350 mg total) by mouth daily as needed for muscle spasms. 10/21/14  Yes Richard A  Sater, MD  gabapentin (NEURONTIN) 600 MG tablet Take 1 tablet (600 mg total) by mouth 3 (three) times daily. 10/21/14  Yes Britt Bottom, MD  interferon beta-1a (REBIF) 44 MCG/0.5ML SOSY injection Inject 22 mcg into the skin 3 (three) times a week.    Yes Historical Provider, MD  lisinopril (PRINIVIL,ZESTRIL) 10 MG tablet TAKE 1 TABLET (10 MG TOTAL) BY MOUTH DAILY. 07/26/14  Yes Historical Provider, MD  metroNIDAZOLE (METROGEL) 1 % gel Apply 1 application topically daily.  01/09/14  Yes Historical Provider, MD   Multiple Vitamin (THERA) TABS Take 1 tablet by mouth.   Yes Historical Provider, MD  Omega-3 Fatty Acids (FISH OIL) 500 MG CAPS Take 1 capsule by mouth daily.    Yes Historical Provider, MD   BP 186/104 mmHg  Pulse 92  Temp(Src) 98 F (36.7 C) (Oral)  Resp 20  Ht 5\' 6"  (1.676 m)  Wt 135 lb (61.236 kg)  BMI 21.80 kg/m2  SpO2 98% Physical Exam  Constitutional: She is oriented to person, place, and time. She appears well-developed and well-nourished.  HENT:  Head: Normocephalic.  Mouth/Throat: Oropharynx is clear and moist. No oropharyngeal exudate.  Eyes: Conjunctivae and EOM are normal. Pupils are equal, round, and reactive to light.  Horizontal nystagmus noted on exam.  Neck: Normal range of motion. Neck supple.  Cardiovascular: Normal rate, regular rhythm, normal heart sounds and intact distal pulses.  Exam reveals no gallop and no friction rub.   No murmur heard. Pulmonary/Chest: Effort normal and breath sounds normal. No respiratory distress. She has no wheezes.  Abdominal: Soft. Bowel sounds are normal. There is no tenderness. There is no rebound and no guarding.  Musculoskeletal: Normal range of motion. She exhibits no edema or tenderness.  Neurological: She is alert and oriented to person, place, and time.  alert, oriented x3 speech: normal in context and clarity memory: intact grossly cranial nerves II-XII: intact motor strength: full proximally and distally no involuntary movements or tremors sensation: intact to light touch diffusely  cerebellar: finger-to-nose and heel-to-shin intact gait: ambulates w/ no difficulty  Skin: Skin is warm and dry.  Psychiatric: She has a normal mood and affect. Her behavior is normal.  Nursing note and vitals reviewed.   ED Course  Procedures (including critical care time) Labs Review Labs Reviewed  CBC WITH DIFFERENTIAL/PLATELET - Abnormal; Notable for the following:    Neutrophils Relative % 82 (*)    All other components  within normal limits  BASIC METABOLIC PANEL - Abnormal; Notable for the following:    Glucose, Bld 106 (*)    GFR calc non Af Amer 87 (*)    All other components within normal limits  I-STAT TROPOININ, ED    Imaging Review Ct Head Wo Contrast  11/18/2014   CLINICAL DATA:  Intermittent dizziness with nausea and vomiting for 6 weeks, more severe today  EXAM: CT HEAD WITHOUT CONTRAST  TECHNIQUE: Contiguous axial images were obtained from the base of the skull through the vertex without intravenous contrast.  COMPARISON:  None.  FINDINGS: The ventricles are normal in size and configuration. There is no mass, hemorrhage, extra-axial fluid collection, or midline shift. Gray-white compartments appear normal. There is no acute infarct apparent. There is calcification in the high medial gland which appears benign. Bony calvarium appears intact. The mastoid air cells are clear. The patient has had antrostomies bilaterally. There is opacification of an anterior ethmoid air cell.  IMPRESSION: No intracranial mass, hemorrhage, or focal gray - white  compartment lesion/ acute appearing infarct. Status post antrostomies. Anterior right ethmoid sinus disease.   Electronically Signed   By: Lowella Grip III M.D.   On: 11/18/2014 13:58     EKG Interpretation   Date/Time:  Monday November 18 2014 11:50:43 EDT Ventricular Rate:  74 PR Interval:  106 QRS Duration: 82 QT Interval:  415 QTC Calculation: 460 R Axis:   78 Text Interpretation:  Sinus rhythm Short PR interval Confirmed by Aline Brochure   MD, Rogelio Winbush ([id number]) on 11/18/2014 12:30:03 PM      MDM   Final diagnoses:  Dizziness    12:52 PM 64 y.o. female w hx of MS, meningioma s/p gamma knife, HTN who presents with sudden onset dizziness which began around 10 AM this morning while at the dentist. She notes that she began having similar dizziness about 6 months ago which was related to position changes and lasted for about 3 weeks. She notes the dizziness  returned about 2 days ago and has been intermittent since that time. She also notes sudden onset 6 out of 10 occipital headache with dizziness this morning. She is afebrile and mildly hypertensive on my exam. She does have reproduction of her dizziness with head movements and eye movement. Likely a peripheral cause of her symptoms but will get CT given history of meningioma.  Likely peripheral cause of vertigo given hx of similar sx, acute onset w/ fatigue, and worsening w/ position changes. Pt w/ significant improvement in ED. HA nearly resolved. Pt ambulatory.  I have discussed the diagnosis/risks/treatment options with the patient and believe the pt to be eligible for discharge home to follow-up with her neurologist given her hx of MS and meningioma. We also discussed returning to the ED immediately if new or worsening sx occur. We discussed the sx which are most concerning (e.g., worsening dizziness, fever) that necessitate immediate return. Medications administered to the patient during their visit and any new prescriptions provided to the patient are listed below.  Medications given during this visit Medications  sodium chloride 0.9 % bolus 1,000 mL (0 mLs Intravenous Stopped 11/18/14 1507)  metoCLOPramide (REGLAN) injection 5 mg (5 mg Intravenous Given 11/18/14 1330)  diphenhydrAMINE (BENADRYL) injection 25 mg (25 mg Intravenous Given 11/18/14 1328)    Discharge Medication List as of 11/18/2014  3:29 PM    START taking these medications   Details  meclizine (ANTIVERT) 25 MG tablet Take 1 tablet (25 mg total) by mouth 4 (four) times daily., Starting 11/18/2014, Until Discontinued, Print    ondansetron (ZOFRAN ODT) 4 MG disintegrating tablet 4mg  ODT q4 hours prn nausea/vomit, Print         Pamella Pert, MD 11/18/14 2112

## 2014-11-18 NOTE — ED Notes (Signed)
Bed: WA12 Expected date:  Expected time:  Means of arrival:  Comments: Ems-dizziness 

## 2014-11-18 NOTE — Discharge Instructions (Signed)

## 2014-11-25 ENCOUNTER — Ambulatory Visit (INDEPENDENT_AMBULATORY_CARE_PROVIDER_SITE_OTHER): Payer: BLUE CROSS/BLUE SHIELD | Admitting: Neurology

## 2014-11-25 ENCOUNTER — Encounter: Payer: Self-pay | Admitting: Neurology

## 2014-11-25 VITALS — BP 126/82 | HR 70 | Resp 12 | Ht 66.0 in | Wt 136.6 lb

## 2014-11-25 DIAGNOSIS — G35 Multiple sclerosis: Secondary | ICD-10-CM | POA: Diagnosis not present

## 2014-11-25 DIAGNOSIS — H8102 Meniere's disease, left ear: Secondary | ICD-10-CM

## 2014-11-25 DIAGNOSIS — H8110 Benign paroxysmal vertigo, unspecified ear: Secondary | ICD-10-CM | POA: Insufficient documentation

## 2014-11-25 DIAGNOSIS — I10 Essential (primary) hypertension: Secondary | ICD-10-CM | POA: Diagnosis not present

## 2014-11-25 DIAGNOSIS — H8112 Benign paroxysmal vertigo, left ear: Secondary | ICD-10-CM

## 2014-11-25 MED ORDER — HYDROCHLOROTHIAZIDE 25 MG PO TABS: 25.0000 mg | ORAL_TABLET | Freq: Every day | ORAL | Status: DC

## 2014-11-25 NOTE — Patient Instructions (Signed)
Meniere Disease Meniere disease is an inner ear disorder. It causes attacks of a spinning sensation (vertigo) and ringing in the ear (tinnitus). It also causes hearing loss and a sensation of fullness or pressure in your ear.  Meniere disease is lifelong. It may get worse over time. Symptoms usually begin in one ear but may eventually affect both ears.  CAUSES Meniere disease is caused by having too much of the fluid that is in your inner ear (endolymph). When endolymph builds up in your inner ear, it affects the nerves that control balance and hearing. The reason for the endolymph buildup is not known. Possible causes include:  Allergy.  An abnormal reaction of the body's defense system (autoimmune disease).  Viral infection of the inner ear.  Head injury. RISK FACTORS  Age older than 40 years.  Family history of Meniere disease.  History of autoimmune disease.  History of migraine headaches. SIGNS AND SYMPTOMS Symptoms of Meniere disease can come and go and may last for up to 4 hours at a time. Symptoms usually start in one ear and may become more frequent and eventually involve both ears. Symptoms can include:  Fullness and pressure in your ear.  Roaring or ringing in your ear.  Vertigo and loss of balance.  Decreased hearing.  Nausea and vomiting. DIAGNOSIS Your health care provider will perform a physical exam. Tests may be done to confirm a diagnosis of Meniere disease. These tests may include:  A hearing test (audiogram).  An electronystagmogram. This tests your balance nerve (vestibular nerve).  Imaging studies, such as CT or MRI, of your inner ear. TREATMENT There is no cure for Meniere disease, but it can be managed. Management may include:  A diet that may help relieve symptoms of Meniere disease.  Use of medicines to reduce:  Vertigo.  Nausea.  Fluid retention.  Use of an air pressure pulse generator. This is a machine that sends small pressure  pulses into your ear canal.  Inner ear surgery (rare). When you experience symptoms, it can be helpful to lie down on a flat surface and focus your eyes on one object that does not move. Try to stay in that position until your symptoms go away.  HOME CARE INSTRUCTIONS   Take medicines only as directed by your health care provider.  Eat the same amount of food at the same time every day, including snacks.  Do not skip meals.  Limit the salt in your diet to 1,000 mg a day.  Avoid caffeine.  Limit alcoholic drinks to one drink a day.  Do not eat foods containing monosodium glutamate (MSG).  Drink enough fluids to keep your urine clear or pale yellow.  Do not use any tobacco products including cigarettes, chewing tobacco, or electronic cigarettes. If you need help quitting, ask your health care provider.  Find ways to reduce or avoid stress. SEEK MEDICAL CARE IF:   You have symptoms that last longer than 4 hours.  You have new or more severe symptoms. SEEK IMMEDIATE MEDICAL CARE IF:   You have been vomiting for 24 hours.  You are not able to keep fluids down.  You have chest pain or trouble breathing. Document Released: 08/13/2000 Document Revised: 12/31/2013 Document Reviewed: 07/30/2013 [facility] Patient Information 2015 [place], [place]. This information is not intended to replace advice given to you by your health care provider. Make sure you discuss any questions you have with your health care provider.

## 2014-11-25 NOTE — Progress Notes (Signed)
[facility]  PATIENT: Dorothy Meyer DOB: 1951-01-23     HISTORICAL  CHIEF COMPLAINT:  Chief Complaint  Patient presents with  . Multiple Sclerosis    Sts. she tolerates Betaseron well. Sts. she has had intermittent vertigo over the last 6 mos., worse in the last 2 weeks.  She was seen at [facility]. CT head was ok, so she was d/c with Meclizine and Phenergan./fim  . Dizziness    HISTORY OF PRESENT ILLNESS:  Dorothy Meyer is a 64 yo woman with MS who has had mild positional vertigo x 6 months, much worse the past 2 weeks  She was having mild vertigo that would occur intermittently, sometimes with movements for the past 6 months. Those episodes were not associated with nausea and vomiting.    Over the last 2 weeks she has had 2 episodes of much more profound vertigo associated with nausea and vomiting. These episodes lasted at least 2 and probably more like 3 hours. She felt off balance and she was veering towards the right. He went to the emergency room with the second episode because her blood pressure was elevated. I reviewed the CT scan that was performed in the emergency room. The brain appears normal. The mastoid air cells appear normal. She has had prior sinus surgery but there is no sinusitis.  With the 2 episodes were occurring, she found that staying still made her feel slightly better and moving made her feel worse.  She has noted that she has had progressive left hearing loss, associated with some tinnitus over the last 5 years. About 15 years ago, she had a hearing test and was told that there was some left conduction loss.    She note asymmetry of her hearing. She has constant tinnitus.  About 20 years ago, she used a lot of NSAIDs and wonders if the onset of the tinnitus could've been related.  Although she has not noted any dietary trigger to the 2 severe episodes, she does believe she is having more salt intake now than she did in the  past.  She denies any numbness, weakness, vision changes or clumsiness currently.  She has multiple sclerosis and is on Betaseron. She is tolerating the injections well. She has had some ataxia with her MS in the past. Current episode of vertigo and clumsiness feels different than her MS episodes.  ROS:  Out of a complete 14 system review of symptoms, the patient complains only of the following symptoms, and all other reviewed systems are negative.  Left sided hearing loss. Nausea and vomiting. Vertigo   ALLERGIES: Allergies  Allergen Reactions  . Hydrocodone-Acetaminophen Nausea And Vomiting  . Propoxyphene Nausea And Vomiting  . Tramadol Nausea And Vomiting    HOME MEDICATIONS:  Current outpatient prescriptions:  .  albuterol (PROVENTIL HFA;VENTOLIN HFA) 108 (90 BASE) MCG/ACT inhaler, Inhale 2 puffs into the lungs every 4 (four) hours as needed for wheezing or shortness of breath. , Disp: , Rfl:  .  ALPRAZolam (XANAX) 0.5 MG tablet, Take 1 tablet (0.5 mg total) by mouth at bedtime as needed for anxiety., Disp: 30 tablet, Rfl: 5 .  baclofen (LIORESAL) 20 MG tablet, Take 1 tablet (20 mg total) by mouth 2 (two) times daily., Disp: 180 each, Rfl: 3 .  budesonide (PULMICORT) 1 MG/2ML nebulizer solution, Take 1 mg by nebulization daily as needed (shortness of breath). , Disp: , Rfl: 10 .  buPROPion (WELLBUTRIN XL) 150 MG 24 hr  tablet, Take 1 tablet (150 mg total) by mouth daily., Disp: 90 tablet, Rfl: 3 .  calcium-vitamin D (OSCAL) 250-125 MG-UNIT per tablet, Take 1 tablet by mouth daily. , Disp: , Rfl:  .  carisoprodol (SOMA) 350 MG tablet, Take 1 tablet (350 mg total) by mouth daily as needed for muscle spasms., Disp: 30 tablet, Rfl: 5 .  gabapentin (NEURONTIN) 600 MG tablet, Take 1 tablet (600 mg total) by mouth 3 (three) times daily., Disp: 270 tablet, Rfl: 3 .  interferon beta-1a (REBIF) 44 MCG/0.5ML SOSY injection, Inject 22 mcg into the skin 3 (three) times a week. , Disp: , Rfl:   .  lisinopril (PRINIVIL,ZESTRIL) 10 MG tablet, TAKE 1 TABLET (10 MG TOTAL) BY MOUTH DAILY., Disp: , Rfl:  .  meclizine (ANTIVERT) 25 MG tablet, Take 1 tablet (25 mg total) by mouth 4 (four) times daily., Disp: 28 tablet, Rfl: 0 .  metroNIDAZOLE (METROGEL) 1 % gel, Apply 1 application topically daily. , Disp: , Rfl:  .  Multiple Vitamin (THERA) TABS, Take 1 tablet by mouth., Disp: , Rfl:  .  ondansetron (ZOFRAN ODT) 4 MG disintegrating tablet, 4mg  ODT q4 hours prn nausea/vomit, Disp: 15 tablet, Rfl: 0 .  Omega-3 Fatty Acids (FISH OIL) 500 MG CAPS, Take 1 capsule by mouth daily. , Disp: , Rfl:   PAST MEDICAL HISTORY: Past Medical History  Diagnosis Date  . Hearing loss   . Hypertension   . Multiple sclerosis   . Vision abnormalities   . Raynaud disease   . Chronic sinusitis   . Benign brain tumor     PAST SURGICAL HISTORY: Past Surgical History  Procedure Laterality Date  . Brain tumor excision    . Hystrectomy    . Nasal sinus surgery      FAMILY HISTORY: Family History  Problem Relation Age of Onset  . Healthy Mother   . Lung cancer Father     SOCIAL HISTORY:  History   Social History  . Marital Status: Married    Spouse Name: N/A  . Number of Children: N/A  . Years of Education: N/A   Occupational History  . Not on file.   Social History Main Topics  . Smoking status: Never Smoker   . Smokeless tobacco: Never Used  . Alcohol Use: No  . Drug Use: No  . Sexual Activity: Not on file   Other Topics Concern  . Not on file   Social History Narrative     PHYSICAL EXAM  Filed Vitals:   11/25/14 1010  BP: 126/82  Pulse: 70  Resp: 12  Height: 5\' 6"  (1.676 m)  Weight: 136 lb 9.6 oz (61.961 kg)    Body mass index is 22.06 kg/(m^2).   General: The patient is well-developed and well-nourished and in no acute distress  Ears:  Tympanic membranes were intact. Was only a small amount of cerumen. Hearing was reduced on the left but the Weber did not  lateralize.  Neck: The neck is supple, no carotid bruits are noted.  The neck is nontender.  Skin: Extremities are without significant edema.   Neurologic Exam  Mental status: The patient is alert and oriented x 3 at the time of the examination. The patient has apparent normal recent and remote memory, with an apparently normal attention span and concentration ability.   Speech is normal.  Cranial nerves: Extraocular movements are full.   Facial symmetry is present. There is good facial sensation to soft touch bilaterally.Facial strength is  normal.  Trapezius and sternocleidomastoid strength is normal. No dysarthria is noted.  The tongue is midline, and the patient has symmetric elevation of the soft palate. Hearing is reduced on the left and the Weber did not lateralize.  Motor:  Muscle bulk is normal.   Tone is normal. Strength is  5 / 5 in all 4 extremities.   Sensory: Sensory testing is intact to pinprick, soft touch and vibration sensation in all 4 extremities.  Coordination: Cerebellar testing reveals good finger-nose-finger and heel-to-shin bilaterally.  Gait and station: Station is normal.   Gait is normal. Tandem gait is mildly wide (baseline). Romberg is negative.   Reflexes: Deep tendon reflexes are symmetric and normal bilaterally.   Barany testing did not show any vertigo or evoked nystagmus.    DIAGNOSTIC DATA (LABS, IMAGING, TESTING) - I reviewed patient records, labs, notes, testing and imaging myself where available.  Lab Results  Component Value Date   WBC 9.2 11/18/2014   HGB 14.1 11/18/2014   HCT 43.8 11/18/2014   MCV 94.6 11/18/2014   PLT 268 11/18/2014      Component Value Date/Time   NA 140 11/18/2014 1235   K 4.0 11/18/2014 1235   CL 102 11/18/2014 1235   CO2 26 11/18/2014 1235   GLUCOSE 106* 11/18/2014 1235   BUN 14 11/18/2014 1235   CREATININE 0.78 11/18/2014 1235   CALCIUM 9.6 11/18/2014 1235   GFRNONAA 87* 11/18/2014 1235   GFRAA >90  11/18/2014 1235      ASSESSMENT AND PLAN  Benign paroxysmal positional vertigo, left  Meniere's disease of left ear  Multiple sclerosis, relapsing-remitting  Essential hypertension   In summary, Derionna Salvador is a 64 year old woman with multiple sclerosis who has had 2 severe episodes of vertigo associated with nausea and vomiting setting of chronic left tinnitus and some hearing loss. I believe this is more likely to represent Mnire's disease then multiple sclerosis. I will go ahead and start a diuretic. She is also advised to take one of her Ativan was at the onset of the spell as benzodiazepines may help better than antinausea medication. We also discussed a lower sodium diet. This might also help her essential hypertension. She has an ENT doctor that she has seen for sinusitis in the past and I have advised her to follow-up with him as she should probably get hearing tested as well. If the spells persist, other treatment may be needed.  She will return to see me in 4 months or sooner if she has new or worsening neurologic symptoms.   Adis Sturgill A. Felecia Shelling, MD, PhD 5/83/0940, 76:80 AM Certified in Neurology, Clinical Neurophysiology, Sleep Medicine, Pain Medicine and Neuroimaging  [facility] [address], [place], [place] [address] [phone]

## 2015-02-06 ENCOUNTER — Other Ambulatory Visit: Payer: Self-pay | Admitting: *Deleted

## 2015-02-06 MED ORDER — HYDROCHLOROTHIAZIDE 25 MG PO TABS: 25.0000 mg | ORAL_TABLET | Freq: Every day | ORAL | Status: DC

## 2015-03-05 ENCOUNTER — Other Ambulatory Visit: Payer: Self-pay | Admitting: Neurology

## 2015-03-05 NOTE — Telephone Encounter (Signed)
Faith verified the patient is using Rebif, and the last OV which notes Betaseron is an error.

## 2015-03-11 ENCOUNTER — Other Ambulatory Visit: Payer: Self-pay | Admitting: Neurology

## 2015-03-13 ENCOUNTER — Other Ambulatory Visit: Payer: Self-pay | Admitting: Neurology

## 2015-04-23 ENCOUNTER — Ambulatory Visit (INDEPENDENT_AMBULATORY_CARE_PROVIDER_SITE_OTHER): Payer: BLUE CROSS/BLUE SHIELD | Admitting: Neurology

## 2015-04-23 ENCOUNTER — Encounter: Payer: Self-pay | Admitting: Neurology

## 2015-04-23 VITALS — BP 116/80 | HR 68 | Resp 12 | Ht 66.0 in | Wt 135.6 lb

## 2015-04-23 DIAGNOSIS — R5383 Other fatigue: Secondary | ICD-10-CM | POA: Diagnosis not present

## 2015-04-23 DIAGNOSIS — R413 Other amnesia: Secondary | ICD-10-CM

## 2015-04-23 DIAGNOSIS — H8102 Meniere's disease, left ear: Secondary | ICD-10-CM

## 2015-04-23 DIAGNOSIS — R3911 Hesitancy of micturition: Secondary | ICD-10-CM

## 2015-04-23 DIAGNOSIS — R260 Ataxic gait: Secondary | ICD-10-CM | POA: Diagnosis not present

## 2015-04-23 DIAGNOSIS — D329 Benign neoplasm of meninges, unspecified: Secondary | ICD-10-CM | POA: Diagnosis not present

## 2015-04-23 DIAGNOSIS — G35 Multiple sclerosis: Secondary | ICD-10-CM

## 2015-04-23 MED ORDER — CARISOPRODOL 350 MG PO TABS: 350.0000 mg | ORAL_TABLET | Freq: Every day | ORAL | Status: DC | PRN

## 2015-04-23 MED ORDER — HYDROCHLOROTHIAZIDE 25 MG PO TABS
25.0000 mg | ORAL_TABLET | Freq: Every day | ORAL | Status: DC
Start: 2015-04-23 — End: 2016-03-23

## 2015-04-23 MED ORDER — ALPRAZOLAM 0.5 MG PO TABS: 0.5000 mg | ORAL_TABLET | Freq: Every evening | ORAL | Status: DC | PRN

## 2015-04-23 MED ORDER — BACLOFEN 10 MG PO TABS: 10.0000 mg | ORAL_TABLET | Freq: Four times a day (QID) | ORAL | Status: DC

## 2015-04-23 NOTE — Patient Instructions (Signed)
Try to stay active and exercise as tolerated Call if any neurologic changes noted

## 2015-04-23 NOTE — Progress Notes (Signed)
[facility]  PATIENT: Dorothy Meyer DOB: 1950-11-05  REFERRING DOCTOR OR PCP:  Scarlette Slice  SOURCE: Patient  _________________________________   HISTORICAL  CHIEF COMPLAINT:  Chief Complaint  Patient presents with  . Multiple Sclerosis    Sts. she continues to tolerate Rebif well.  Sts. she still has alot of bruising at inj. sites, but they heal well and she does not want to switch meds.  Sts. vertigo is completely resolved with HCTZ and low Na diet.  She denies new or worsening sx/fim    HISTORY OF PRESENT ILLNESS:  Dorothy Meyer is a 64 year old woman with MS and probable Meniere's.      MS History:    She was diagnosed with multiple sclerosis in the 1997 after she presented with numbness in both arms. An MRI of the cervical spine showed a plaque at C2C3.   An MRI of the brain at that time showed other lesions, compatible with the diagnosis of multiple sclerosis. She had only 3 plaques in the brain initially and treatment was deferred. However, one year later she had optic neuritis, confirmed the diagnosis of multiple sclerosis. Initially, she was placed on Avonex. She then tried Copaxonelater on as she was having flulike symptoms with Avonex. However, she had severe skin reactions with Copaxone that was stopped. She then went on Rebif. She has been on Rebif for the past 8 years or so and tolerates it very well. Her MS has been mostly stable while on the Rebif. Last exacerbation was 2015.   Her Rebif dose is 22 g. She tolerates that dose well. Her last MRI of the brain was in 2015 and did not show any new lesions.  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 On the cervical spine MRI she has 3 hyperintense foci consistent with demyelinating plaque.  Vertigo/Meniere's:   She presented with severe vertigo (non-positional) in early 2016, that worsened 43/2016.    She had N/V that was severe and balance was so off  that she could barely walk during an attack.   She started HCTZ 25 mg and has done very well with no additional spells  Gait:  She notes mild  weakness in the right leg with a foot drop.. She does not need to use a cane. This is been stable for many years without clear worsening since I started to see her.  Vision:   In the past, she has had right optic neuritis in 1996. She has had other episodes of severe eye pain without much vision change..   Recovery was good (20/30 with good color vision) No diplopia  Bladder:   She denies any significant urinary frequency or urgency but has noted some  hesitancy.  Fatigue/sleep:  She is fine almost every morning but very tired in the afternoon.   She notes physical more than cognitive fatigue. Bad days are often triggered by stress or by heat. She has tried Provigil and Ritalin in the past but they did not help her fatigue significantly.  Mood dysfunction:   She denies depression or anxiety. She remains on Wellbutrin. She tolerates it well.    Cognitive dysfunction:  She notes some difficulty with focus, attention, verbal fluency and multitasking. She stopped working 2014 and her cognitive issues are less of an issue now. She feels she is able to do better with the mild cognitive dysfunction with current activities than when she was working.  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  She sees at [facility] for this.   REVIEW OF SYSTEMS: Constitutional: No fevers, chills, sweats, or change in appetite.   Fatigue is stable Eyes: No visual changes, double vision, eye pain Ear, nose and throat: No hearing loss, ear pain, nasal congestion, sore throat Cardiovascular: No chest pain, palpitations Respiratory: No shortness of breath at rest or with exertion.   No wheezes GastrointestinaI: No nausea, vomiting, diarrhea, abdominal pain, fecal incontinence Genitourinary: No dysuria,  urinary retention or frequency.  No nocturia. Musculoskeletal: No neck pain, back pain Integumentary: No rash, pruritus, skin lesions Neurological: as above Psychiatric: No current depression at this time.  No anxiety Endocrine: No palpitations, diaphoresis, change in appetite, change in weigh or increased thirst Hematologic/Lymphatic: No anemia, purpura, petechiae. Allergic/Immunologic: No itchy/runny eyes, nasal congestion, recent allergic reactions, rashes  ALLERGIES: Allergies  Allergen Reactions  . Hydrocodone-Acetaminophen Nausea And Vomiting  . Propoxyphene Nausea And Vomiting  . Tramadol Nausea And Vomiting    HOME MEDICATIONS:  Current outpatient prescriptions:  .  albuterol (PROVENTIL HFA;VENTOLIN HFA) 108 (90 BASE) MCG/ACT inhaler, Inhale 2 puffs into the lungs every 4 (four) hours as needed for wheezing or shortness of breath. , Disp: , Rfl:  .  ALPRAZolam (XANAX) 0.5 MG tablet, Take 1 tablet (0.5 mg total) by mouth at bedtime as needed for anxiety., Disp: 30 tablet, Rfl: 5 .  baclofen (LIORESAL) 20 MG tablet, Take 1 tablet (20 mg total) by mouth 2 (two) times daily., Disp: 180 each, Rfl: 3 .  budesonide (PULMICORT) 1 MG/2ML nebulizer solution, Take 1 mg by nebulization daily as needed (shortness of breath). , Disp: , Rfl: 10 .  buPROPion (WELLBUTRIN XL) 150 MG 24 hr tablet, Take 1 tablet (150 mg total) by mouth daily., Disp: 90 tablet, Rfl: 3 .  calcium-vitamin D (OSCAL) 250-125 MG-UNIT per tablet, Take 1 tablet by mouth daily. , Disp: , Rfl:  .  carisoprodol (SOMA) 350 MG tablet, Take 1 tablet (350 mg total) by mouth daily as needed for muscle spasms., Disp: 30 tablet, Rfl: 5 .  gabapentin (NEURONTIN) 600 MG tablet, Take 1 tablet (600 mg total) by mouth 3 (three) times daily., Disp: 270 tablet, Rfl: 3 .  hydrochlorothiazide (HYDRODIURIL) 25 MG tablet, Take 1 tablet (25 mg total) by mouth daily., Disp: 90 tablet, Rfl: 3 .  interferon beta-1a (REBIF) 44 MCG/0.5ML SOSY  injection, Inject 22 mcg into the skin 3 (three) times a week. , Disp: , Rfl:  .  lisinopril (PRINIVIL,ZESTRIL) 10 MG tablet, TAKE 1 TABLET (10 MG TOTAL) BY MOUTH DAILY., Disp: , Rfl:  .  metroNIDAZOLE (METROGEL) 1 % gel, Apply 1 application topically daily. , Disp: , Rfl:  .  Multiple Vitamin (THERA) TABS, Take 1 tablet by mouth., Disp: , Rfl:  .  Omega-3 Fatty Acids (FISH OIL) 500 MG CAPS, Take 1 capsule by mouth daily. , Disp: , Rfl:  .  REBIF 22 MCG/0.5ML SOSY, INJECT 0.5ML (22MCG) SUBCUTANEOUSLY THREE TIMES PER WEEK -REFRIGERATEDO NOT FREEZE, Disp: 36 Syringe, Rfl: 1  PAST MEDICAL HISTORY: Past Medical History  Diagnosis Date  . Hearing loss   . Hypertension   . Multiple sclerosis   . Vision abnormalities   . Raynaud disease   . Chronic sinusitis   . Benign brain tumor     PAST SURGICAL HISTORY: Past Surgical History  Procedure Laterality Date  . Brain tumor excision    .  Hystrectomy    . Nasal sinus surgery      FAMILY HISTORY: Family History  Problem Relation Age of Onset  . Healthy Mother   . Lung cancer Father     SOCIAL HISTORY:  Social History   Social History  . Marital Status: Married    Spouse Name: N/A  . Number of Children: N/A  . Years of Education: N/A   Occupational History  . Not on file.   Social History Main Topics  . Smoking status: Never Smoker   . Smokeless tobacco: Never Used  . Alcohol Use: No  . Drug Use: No  . Sexual Activity: Not on file   Other Topics Concern  . Not on file   Social History Narrative     PHYSICAL EXAM  Filed Vitals:   04/23/15 0904  BP: 116/80  Pulse: 68  Resp: 12  Height: 5\' 6"  (1.676 m)  Weight: 135 lb 9.6 oz (61.508 kg)    Body mass index is 21.9 kg/(m^2).   General: The patient is well-developed and well-nourished and in no acute distress  Skin: Extremities are without rash or significant edema.   Neurologic Exam  Mental status: The patient is alert and oriented x 3 at the time of the  examination. The patient has apparent normal recent and remote memory, with an apparently normal attention span and concentration ability.   Speech is normal.  Cranial nerves: Extraocular movements are full.  Colors are symmetric.  Facial symmetry is present. There is good facial sensation to soft touch bilaterally.Facial strength is normal.  Trapezius and sternocleidomastoid strength is normal. No dysarthria is noted.  The tongue is midline, and the patient has symmetric elevation of the soft palate. No obvious hearing deficits are noted.  Motor:  Muscle bulk is normal.   Tone is normal. Strength is  5 / 5 in all 4 extremities.   Sensory: Sensory testing is intact to temp, soft touch and vibration sensation in all 4 extremities.  Coordination: Cerebellar testing reveals good finger-nose-finger and slightly reduced heel-to-shin bilaterally.  Gait and station: Station is normal.   Gait is minimally wide with a mild right foot drop and tandem gait is moderately wide. Romberg is negative.   Reflexes: Deep tendon reflexes are symmetric and normal bilaterally.                             DIAGNOSTIC DATA (LABS, IMAGING, TESTING) - I reviewed patient records, labs, notes, testing and imaging myself where available.   About 48 pages of notes from [facility] neurology were reviewed including MRI reports and lab reports.       ASSESSMENT AND PLAN  DS (disseminated sclerosis)  Meniere's disease of left ear  Meningioma  Multiple sclerosis, relapsing-remitting  Urinary hesitancy  Other fatigue  Memory loss due to medical condition  Ataxic gait   1.   Continue Rebif 22 mcg tiw 2.  Reduce baclofen -- may help bladder hesitancy 3.   Continue other med's, refill 4.  She will return to see me in 6 months or sooner if she has new or worsening neurologic symptoms.   Richard A. Felecia Shelling, MD, PhD 7/98/9211, 9:41 AM Certified in Neurology, Clinical Neurophysiology, Sleep Medicine, Pain  Medicine and Neuroimaging  [facility] [address], [place], [place] [address] [phone]   A [phone]

## 2015-06-27 ENCOUNTER — Encounter: Payer: Self-pay | Admitting: *Deleted

## 2015-09-23 ENCOUNTER — Other Ambulatory Visit: Payer: Self-pay | Admitting: *Deleted

## 2015-09-23 MED ORDER — GABAPENTIN 600 MG PO TABS: 600.0000 mg | ORAL_TABLET | Freq: Three times a day (TID) | ORAL | Status: DC

## 2015-09-23 NOTE — Telephone Encounter (Signed)
Rx. escribed to [facility] per faxed request/fim

## 2015-10-21 ENCOUNTER — Telehealth: Payer: Self-pay | Admitting: Neurology

## 2015-10-21 NOTE — Telephone Encounter (Signed)
Pt called to see if our office received her MRI results from Dr. Salomon Fick with [facility]. She had the scan in December 2016. Please call pt with update [phone]

## 2015-10-22 NOTE — Telephone Encounter (Signed)
I have spoken with Barbee this morning, and per RAS, advised that the official mri report from [facility] sts. there  have been no changes.  She sts. she has a cd of her mri and will bring it with her on Friday-would like RAS to review it/fim

## 2015-10-22 NOTE — Telephone Encounter (Signed)
Please let her know that the official report from [facility] showed that there were no changes.

## 2015-10-24 ENCOUNTER — Encounter: Payer: Self-pay | Admitting: Neurology

## 2015-10-24 ENCOUNTER — Ambulatory Visit (INDEPENDENT_AMBULATORY_CARE_PROVIDER_SITE_OTHER): Payer: BLUE CROSS/BLUE SHIELD | Admitting: Neurology

## 2015-10-24 VITALS — BP 124/80 | HR 70 | Resp 16 | Ht 66.0 in | Wt 136.2 lb

## 2015-10-24 DIAGNOSIS — G35 Multiple sclerosis: Secondary | ICD-10-CM | POA: Diagnosis not present

## 2015-10-24 DIAGNOSIS — R5383 Other fatigue: Secondary | ICD-10-CM | POA: Diagnosis not present

## 2015-10-24 DIAGNOSIS — H8102 Meniere's disease, left ear: Secondary | ICD-10-CM | POA: Diagnosis not present

## 2015-10-24 DIAGNOSIS — R260 Ataxic gait: Secondary | ICD-10-CM

## 2015-10-24 DIAGNOSIS — D329 Benign neoplasm of meninges, unspecified: Secondary | ICD-10-CM | POA: Diagnosis not present

## 2015-10-24 MED ORDER — CARISOPRODOL 350 MG PO TABS: 350.0000 mg | ORAL_TABLET | Freq: Every day | ORAL | Status: DC | PRN

## 2015-10-24 MED ORDER — ALPRAZOLAM 0.5 MG PO TABS: 0.5000 mg | ORAL_TABLET | Freq: Every evening | ORAL | Status: DC | PRN

## 2015-10-24 NOTE — Progress Notes (Signed)
[facility]  PATIENT: Dorothy Meyer DOB: [birth year]  REFERRING DOCTOR OR PCP:  Scarlette Slice  SOURCE: Patient  _________________________________   HISTORICAL  CHIEF COMPLAINT:  Chief Complaint  Patient presents with  . Multiple Sclerosis    Sts. she continues to tolerate Rebif well.  She couldn't remember to take Baclofen 10mg  qid, so she  has continued Baclofen 20mg  bid.  Continues to have bladder issues.  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    .   She is on Rebif 22 mcg and tolerates it well.   No recent exacerbation.  Main MS issues are fatigue, mild gait disturbance and cognitive difficulties.  Vertigo/Meniere's:   Meniere's is doing very well on HCTZ 25 mg with no recent exacerbation.   She presented with severe vertigo (non-positional) in early 2016, that worsened 43/2016.    She had N/V that was severe and balance was so off that she could barely walk during an attack.   She started HCTZ 25 mg and has done very well with no additional spells.    Gait:  She notes mild  weakness in the right leg with a foot drop.. She has not needed a cane recently.    She feels her muscles fatigue easily, especially by the afternoon.  She gets some spasticity, helped by soma.     Vision:   In the past, she has had right optic neuritis in 1996. She has had other episodes of severe eye pain without much vision change..   Recovery was good (20/30 with good color vision) No diplopia  Bladder:   She denies any significant urinary frequency or urgency but has noted some  hesitancy.  Fatigue/sleep:  She is fine almost every morning but very tired in the afternoon.   She notes physical more than cognitive fatigue. . She has tried Provigil and Ritalin in the past but they did not help her fatigue significantly.   She sometimes naps and feels better afterwards.    Mood  dysfunction:   She denies depression or anxiety. She remains on Wellbutrin and tolerates it well.    Cognitive dysfunction:  She has mild difficulty with focus, attention, verbal fluency and multitasking. She feels it is stable.  She stopped working 2014 and her cognitive issues are less of an issue now. She feels she is able to do better with the mild cognitive dysfunction with current activities than when she was working.  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  She sees at [facility] for this. Her last MRI 10/2015 was stablle  MS History:    She was diagnosed with multiple sclerosis in the 1997 after she presented with numbness in both arms. An MRI of the cervical spine showed a plaque at C2C3.   An MRI of the brain at that time showed other lesions, compatible with the diagnosis of multiple sclerosis. She had only 3 plaques in the brain initially and treatment was deferred. However, one year later she had optic neuritis, confirmed the diagnosis of multiple sclerosis. Initially, she was placed on Avonex. She then tried Copaxonelater on as she was having flulike symptoms with Avonex. However, she had severe skin reactions with Copaxone that was stopped. She then went on Rebif. She has been on Rebif for the past 8 years or so  and tolerates it very well. Her MS has been mostly stable while on the Rebif. Last exacerbation was 2015.   Her Rebif dose is 22 g. She tolerates that dose well. Her last MRI of the brain was in 2015 and did not show any new lesions.  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 On the cervical spine MRI she has 3 hyperintense foci consistent with demyelinating plaque.    REVIEW OF SYSTEMS: Constitutional: No fevers, chills, sweats, or change in appetite.   Fatigue is stable Eyes: No visual changes, double vision, eye pain Ear, nose and throat: No hearing  loss, ear pain, nasal congestion, sore throat Cardiovascular: No chest pain, palpitations Respiratory: No shortness of breath at rest or with exertion.   No wheezes GastrointestinaI: No nausea, vomiting, diarrhea, abdominal pain, fecal incontinence Genitourinary: No dysuria, urinary retention or frequency.  No nocturia. Musculoskeletal: No neck pain, back pain Integumentary: No rash, pruritus, skin lesions Neurological: as above Psychiatric: No current depression at this time.  No anxiety Endocrine: No palpitations, diaphoresis, change in appetite, change in weigh or increased thirst Hematologic/Lymphatic: No anemia, purpura, petechiae. Allergic/Immunologic: No itchy/runny eyes, nasal congestion, recent allergic reactions, rashes  ALLERGIES: Allergies  Allergen Reactions  . Hydrocodone-Acetaminophen Nausea And Vomiting  . Oxycodone Nausea And Vomiting  . Propoxyphene Nausea And Vomiting  . Tramadol Nausea And Vomiting    HOME MEDICATIONS:  Current outpatient prescriptions:  .  albuterol (PROVENTIL HFA;VENTOLIN HFA) 108 (90 BASE) MCG/ACT inhaler, Inhale 2 puffs into the lungs every 4 (four) hours as needed for wheezing or shortness of breath. , Disp: , Rfl:  .  ALPRAZolam (XANAX) 0.5 MG tablet, Take 1 tablet (0.5 mg total) by mouth at bedtime as needed for anxiety., Disp: 30 tablet, Rfl: 5 .  baclofen (LIORESAL) 10 MG tablet, Take 1 tablet (10 mg total) by mouth 4 (four) times daily., Disp: 120 tablet, Rfl: 11 .  budesonide (PULMICORT) 1 MG/2ML nebulizer solution, Take 1 mg by nebulization daily as needed (shortness of breath). , Disp: , Rfl: 10 .  buPROPion (WELLBUTRIN XL) 150 MG 24 hr tablet, Take 1 tablet (150 mg total) by mouth daily., Disp: 90 tablet, Rfl: 3 .  calcium-vitamin D (OSCAL) 250-125 MG-UNIT per tablet, Take 1 tablet by mouth daily. , Disp: , Rfl:  .  carisoprodol (SOMA) 350 MG tablet, Take 1 tablet (350 mg total) by mouth daily as needed for muscle spasms., Disp: 30  tablet, Rfl: 5 .  gabapentin (NEURONTIN) 600 MG tablet, Take 1 tablet (600 mg total) by mouth 3 (three) times daily., Disp: 270 tablet, Rfl: 3 .  hydrochlorothiazide (HYDRODIURIL) 25 MG tablet, Take 1 tablet (25 mg total) by mouth daily., Disp: 90 tablet, Rfl: 3 .  lisinopril (PRINIVIL,ZESTRIL) 10 MG tablet, TAKE 1 TABLET (10 MG TOTAL) BY MOUTH DAILY., Disp: , Rfl:  .  metroNIDAZOLE (METROGEL) 1 % gel, Apply 1 application topically daily. , Disp: , Rfl:  .  Multiple Vitamin (THERA) TABS, Take 1 tablet by mouth., Disp: , Rfl:  .  Omega-3 Fatty Acids (FISH OIL) 500 MG CAPS, Take 1 capsule by mouth daily. , Disp: , Rfl:  .  Ospemifene (OSPHENA) 60 MG TABS, Take by mouth., Disp: , Rfl:  .  REBIF 22 MCG/0.5ML SOSY, INJECT 0.5ML (22MCG) SUBCUTANEOUSLY THREE TIMES PER WEEK -REFRIGERATEDO NOT FREEZE, Disp: 36 Syringe, Rfl: 1  PAST MEDICAL HISTORY: Past Medical History  Diagnosis Date  . Hearing loss   .  Hypertension   . Multiple sclerosis ([place])   . Vision abnormalities   . Raynaud disease   . Chronic sinusitis   . Benign brain tumor ([place])     PAST SURGICAL HISTORY: Past Surgical History  Procedure Laterality Date  . Brain tumor excision    . Hystrectomy    . Nasal sinus surgery      FAMILY HISTORY: Family History  Problem Relation Age of Onset  . Healthy Mother   . Lung cancer Father     SOCIAL HISTORY:  Social History   Social History  . Marital Status: Married    Spouse Name: N/A  . Number of Children: N/A  . Years of Education: N/A   Occupational History  . Not on file.   Social History Main Topics  . Smoking status: Never Smoker   . Smokeless tobacco: Never Used  . Alcohol Use: No  . Drug Use: No  . Sexual Activity: Not on file   Other Topics Concern  . Not on file   Social History Narrative     PHYSICAL EXAM  Filed Vitals:   10/24/15 0921  BP: 124/80  Pulse: 70  Resp: 16  Height: 5\' 6"  (1.676 m)  Weight: 136 lb 3.2 oz (61.78 kg)    Body mass  index is 21.99 kg/(m^2).   General: The patient is well-developed and well-nourished and in no acute distress  Skin: Extremities are without rash or significant edema.   Neurologic Exam  Mental status: The patient is alert and oriented x 3 at the time of the examination. The patient has apparent normal recent and remote memory, with an apparently normal attention span and concentration ability.   Speech is normal.  Cranial nerves: Extraocular movements are full.  Colors are symmetric.  Facial symmetry is present. There is good facial sensation to soft touch bilaterally.Facial strength is normal.  Trapezius and sternocleidomastoid strength is normal. No dysarthria is noted.  The tongue is midline, and the patient has symmetric elevation of the soft palate. No obvious hearing deficits are noted.  Motor:  Muscle bulk is normal.   Tone is normal. Strength is  5 / 5 in all 4 extremities.   Sensory: Sensory testing is intact to temp, soft touch and vibration sensation in all 4 extremities.  Coordination: Cerebellar testing reveals good finger-nose-finger and slightly reduced heel-to-shin bilaterally.  Gait and station: Station is normal.   Gait is minimally wide with a mild right foot drop and tandem gait is moderately wide. Romberg is negative.   Reflexes: Deep tendon reflexes are symmetric and normal bilaterally.                             DIAGNOSTIC DATA (LABS, IMAGING, TESTING) - I reviewed patient records, labs, notes, testing and imaging myself where available.   About 54 pages of notes from [facility] neurology were reviewed including MRI reports and lab reports.       ASSESSMENT AND PLAN  Multiple sclerosis ([place])  Meningioma ([place])  Meniere's disease of left ear  Ataxic gait  Other fatigue   1.   Continue Rebif 22 mcg tiw.  She gets bloodwork at PCP once a year.    2.   Continue baclofen and gabapentin 3.   Continue other med's, refill 4.  She will return to see me  in 6 months or sooner if she has new or worsening neurologic symptoms.   Richard A. Sater,  MD, PhD [id number], [id number] Certified in Neurology, Clinical Neurophysiology, Sleep Medicine, Pain Medicine and Neuroimaging  [facility] [address], [place], [place] [address] [phone]

## 2015-11-17 ENCOUNTER — Telehealth: Payer: Self-pay | Admitting: Neurology

## 2015-11-17 MED ORDER — BUPROPION HCL ER (XL) 150 MG PO TB24: 150.0000 mg | ORAL_TABLET | Freq: Every day | ORAL | Status: DC

## 2015-11-17 NOTE — Telephone Encounter (Signed)
Patient called regarding buPROPion (WELLBUTRIN XL) 150 MG 24 hr tablet, states Rx is on auto refill and there was a problem, system kicked out Rx, it is now in process but will take 8 days to process through mail order, patient requests 8 day Rx to be sent in to [facility] on [address] in [place].

## 2015-11-17 NOTE — Telephone Encounter (Signed)
30 day rx. escribed to [facility] (the copay for 30 days is the same as the copay for 8 days).  I spoke with Anda Kraft and let  her know this has been done/fim

## 2015-11-20 ENCOUNTER — Telehealth: Payer: Self-pay

## 2015-11-20 MED ORDER — BUPROPION HCL ER (XL) 150 MG PO TB24: 150.0000 mg | ORAL_TABLET | Freq: Every day | ORAL | Status: DC

## 2015-11-20 NOTE — Telephone Encounter (Signed)
Refill request

## 2015-12-03 ENCOUNTER — Other Ambulatory Visit: Payer: Self-pay | Admitting: *Deleted

## 2015-12-03 MED ORDER — BACLOFEN 10 MG PO TABS: 10.0000 mg | ORAL_TABLET | Freq: Four times a day (QID) | ORAL | Status: DC

## 2015-12-03 NOTE — Telephone Encounter (Signed)
Baclofen escribed to [facility] per faxed request/fim

## 2016-01-26 ENCOUNTER — Other Ambulatory Visit: Payer: Self-pay | Admitting: Neurology

## 2016-02-18 ENCOUNTER — Other Ambulatory Visit: Payer: Self-pay | Admitting: Neurology

## 2016-02-25 ENCOUNTER — Other Ambulatory Visit: Payer: Self-pay | Admitting: Neurology

## 2016-03-04 ENCOUNTER — Telehealth: Payer: Self-pay | Admitting: Neurology

## 2016-03-04 NOTE — Telephone Encounter (Signed)
[place].  I am unable to get PA for Soma. It is $12-$14 dollars at multiple pharmacies--she can check GoodRx for best price and downloadable coupon.  I am happy to answer questions about Xanax, Wellbutrin, insurance, if I can, when she calls back/fim

## 2016-03-04 NOTE — Telephone Encounter (Signed)
Pt called said  carisoprodol (SOMA) 350 MG tablet  sts needs PA. The ALPRAZolam (XANAX) 0.5 MG tablet  buPROPion (WELLBUTRIN XL) 150 MG 24 hr tablet she has a few questions reg insurance

## 2016-03-08 ENCOUNTER — Telehealth: Payer: Self-pay | Admitting: *Deleted

## 2016-03-08 MED ORDER — BUPROPION HCL ER (XL) 150 MG PO TB24: 150.0000 mg | ORAL_TABLET | Freq: Every day | ORAL | Status: DC

## 2016-03-08 NOTE — Telephone Encounter (Signed)
Wellbutrin escribed to [facility] per faxed request/fim

## 2016-03-10 ENCOUNTER — Telehealth: Payer: Self-pay | Admitting: *Deleted

## 2016-03-10 MED ORDER — ALPRAZOLAM 0.5 MG PO TABS: 0.5000 mg | ORAL_TABLET | Freq: Every evening | ORAL | Status: DC | PRN

## 2016-03-10 MED ORDER — CARISOPRODOL 350 MG PO TABS: 350.0000 mg | ORAL_TABLET | Freq: Every day | ORAL | Status: DC | PRN

## 2016-03-10 NOTE — Telephone Encounter (Signed)
Alprazolam and Soma rx's faxed to [facility] per faxed request/fim

## 2016-03-17 ENCOUNTER — Other Ambulatory Visit: Payer: Self-pay | Admitting: Neurology

## 2016-03-17 NOTE — Telephone Encounter (Signed)
Pt called said she will run out of medication before refill comes. Please send RX for # 10 days sent to [facility].

## 2016-03-23 ENCOUNTER — Telehealth: Payer: Self-pay | Admitting: Neurology

## 2016-03-23 MED ORDER — HYDROCHLOROTHIAZIDE 25 MG PO TABS: 25.0000 mg | ORAL_TABLET | Freq: Every day | ORAL | 0 refills | Status: DC

## 2016-03-23 NOTE — Telephone Encounter (Signed)
HCTZ escribed to [facility] as requested/fim

## 2016-03-23 NOTE — Telephone Encounter (Signed)
Patient called to advise [facility] didn't put her on auto refill for hydrochlorothiazide (HYDRODIURIL) 25 MG tablet, they have fixed this now, in the meantime needs 10 day Rx of this medication sent to [place], [employer] [place].

## 2016-03-30 ENCOUNTER — Telehealth: Payer: Self-pay | Admitting: Neurology

## 2016-03-30 MED ORDER — BACLOFEN 10 MG PO TABS: 10.0000 mg | ORAL_TABLET | Freq: Four times a day (QID) | ORAL | 3 refills | Status: DC

## 2016-03-30 NOTE — Addendum Note (Signed)
Addended by: Noberto Retort C on: 03/30/2016 09:49 AM   Modules accepted: Orders

## 2016-03-30 NOTE — Telephone Encounter (Signed)
Zina/ [facility] called in and is requesting rx of baclofen (LIORESAL) 10 MG tablet pt had called them and accidentally cancelled the rx and is wanting a refill now.  [phone] , option 1- Ref #: [id number]

## 2016-03-30 NOTE — Telephone Encounter (Signed)
Updated rx sent to [facility].

## 2016-04-01 ENCOUNTER — Other Ambulatory Visit: Payer: Self-pay | Admitting: *Deleted

## 2016-04-21 ENCOUNTER — Ambulatory Visit: Payer: Medicare Other | Admitting: Neurology

## 2016-05-10 ENCOUNTER — Encounter: Payer: Self-pay | Admitting: Neurology

## 2016-05-10 ENCOUNTER — Ambulatory Visit (INDEPENDENT_AMBULATORY_CARE_PROVIDER_SITE_OTHER): Payer: Medicare Other | Admitting: Neurology

## 2016-05-10 ENCOUNTER — Telehealth: Payer: Self-pay | Admitting: Neurology

## 2016-05-10 VITALS — BP 126/82 | HR 68 | Resp 16 | Ht 66.0 in | Wt 141.5 lb

## 2016-05-10 DIAGNOSIS — D329 Benign neoplasm of meninges, unspecified: Secondary | ICD-10-CM

## 2016-05-10 DIAGNOSIS — R260 Ataxic gait: Secondary | ICD-10-CM

## 2016-05-10 DIAGNOSIS — R5383 Other fatigue: Secondary | ICD-10-CM | POA: Diagnosis not present

## 2016-05-10 DIAGNOSIS — M7061 Trochanteric bursitis, right hip: Secondary | ICD-10-CM | POA: Insufficient documentation

## 2016-05-10 DIAGNOSIS — G35 Multiple sclerosis: Secondary | ICD-10-CM

## 2016-05-10 DIAGNOSIS — H8102 Meniere's disease, left ear: Secondary | ICD-10-CM

## 2016-05-10 MED ORDER — METHYLPREDNISOLONE 4 MG PO TABS: 4.0000 mg | ORAL_TABLET | Freq: Every day | ORAL | 0 refills | Status: DC

## 2016-05-10 MED ORDER — CYCLOBENZAPRINE HCL 5 MG PO TABS: 5.0000 mg | ORAL_TABLET | Freq: Three times a day (TID) | ORAL | 3 refills | Status: DC

## 2016-05-10 NOTE — Progress Notes (Signed)
[facility]  PATIENT: Dorothy Meyer DOB: 1951-04-30  REFERRING DOCTOR OR PCP:  Scarlette Slice  SOURCE: Patient  _________________________________   HISTORICAL  CHIEF COMPLAINT:  Chief Complaint  Patient presents with  . Multiple Sclerosis    Sts. she continues to tolerate Rebif well.  Sts. she is having more difficulty with right leg--finds herself  swinging it outward more often.  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     She has had more hip pain lately.   Pian increases with certain movements of her hip. .     MS:   She is on Rebif 22 mcg and tolerates it well.   No recent exacerbation.  She reports fatigue, mild gait disturbance and cognitive difficulties are stable  Gait:  She notes mild  weakness in the right leg with a foot drop.. She has not needed a cane recently.    She feels her muscles fatigue easily, especially by the afternoon.  She gets some spasticity, helped by soma and baclofen.   Medicare no longer covers peripheral muscle relaxants and Soma is expensive.    Dysesthesia:   She noted more L > R leg dysesthesias the past few months.   She was taking gabapentin 300 bid and increased to tid.  Vision:   In the past, she has had right optic neuritis in 1996. She has had other episodes of severe eye pain without much vision change..   Recovery was good (20/30 with good color vision) No diplopia.  Vertigo/Meniere's:   Meniere's is doing very well on HCTZ 25 mg with no recent exacerbation.   She presented with severe vertigo (non-positional) in early 2016, that worsened 43/2016.    She had N/V that was severe and balance was so off that she could barely walk during an attack.     Bladder/bowel:   She denies any significant urinary frequency or urgency but has noted some hesitancy.   Constipation is better with ecercise or prune  juice  Fatigue/sleep:  She is fine almost every morning but very tired in the afternoon.   She notes physical more than cognitive fatigue. . She has tried Provigil and Ritalin in the past but they did not help her fatigue significantly.   She sometimes naps and feels better afterwards.    Mood dysfunction:   She denies depression or anxiety. She remains on Wellbutrin and tolerates it well.    Cognitive dysfunction:  She has mild difficulty with focus, attention, verbal fluency and multitasking. These symptoms are stable.  If she has to do more cognitively one day, she feels cognitively wiped out the next day.       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  She sees at [facility] for this. Her last MRI 10/2015 was stable  MS History:    She was diagnosed with multiple sclerosis in the 1997 after she presented with numbness in both arms. An MRI of the cervical spine showed a plaque at C2C3.   An MRI of the brain at that time showed other lesions, compatible with the diagnosis of multiple sclerosis. She had only 3 plaques in the brain initially and treatment was deferred. However, one year later she had optic neuritis, confirmed the diagnosis of multiple sclerosis. Initially, she was placed on Avonex.  She then tried Copaxonelater on as she was having flulike symptoms with Avonex. However, she had severe skin reactions with Copaxone that was stopped. She then went on Rebif. She has been on Rebif for the past 8 years or so and tolerates it very well. Her MS has been mostly stable while on the Rebif. Last exacerbation was 2015.   Her Rebif dose is 22 g. She tolerates that dose well. Her last MRI of the brain was in 2015 and did not show any new lesions.  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 On the cervical spine MRI she has 3 hyperintense foci consistent with demyelinating  plaque.    REVIEW OF SYSTEMS: Constitutional: No fevers, chills, sweats, or change in appetite.   Fatigue is stable Eyes: No visual changes, double vision, eye pain Ear, nose and throat: No hearing loss, ear pain, nasal congestion, sore throat Cardiovascular: No chest pain, palpitations Respiratory: No shortness of breath at rest or with exertion.   No wheezes GastrointestinaI: No nausea, vomiting, diarrhea, abdominal pain, fecal incontinence Genitourinary: No dysuria, urinary retention or frequency.  No nocturia. Musculoskeletal: No neck pain, back pain Integumentary: No rash, pruritus, skin lesions Neurological: as above Psychiatric: No current depression at this time.  No anxiety Endocrine: No palpitations, diaphoresis, change in appetite, change in weigh or increased thirst Hematologic/Lymphatic: No anemia, purpura, petechiae. Allergic/Immunologic: No itchy/runny eyes, nasal congestion, recent allergic reactions, rashes  ALLERGIES: Allergies  Allergen Reactions  . Hydrocodone-Acetaminophen Nausea And Vomiting  . Oxycodone Nausea And Vomiting  . Propoxyphene Nausea And Vomiting  . Tramadol Nausea And Vomiting    HOME MEDICATIONS:  Current Outpatient Prescriptions:  .  albuterol (PROVENTIL HFA;VENTOLIN HFA) 108 (90 BASE) MCG/ACT inhaler, Inhale 2 puffs into the lungs every 4 (four) hours as needed for wheezing or shortness of breath. , Disp: , Rfl:  .  ALPRAZolam (XANAX) 0.5 MG tablet, Take 1 tablet (0.5 mg total) by mouth at bedtime as needed for anxiety., Disp: 30 tablet, Rfl: 5 .  baclofen (LIORESAL) 10 MG tablet, Take 1 tablet (10 mg total) by mouth 4 (four) times daily., Disp: 360 tablet, Rfl: 3 .  budesonide (PULMICORT) 1 MG/2ML nebulizer solution, Take 1 mg by nebulization daily as needed (shortness of breath). , Disp: , Rfl: 10 .  buPROPion (WELLBUTRIN XL) 150 MG 24 hr tablet, Take 1 tablet (150 mg total) by mouth daily., Disp: 90 tablet, Rfl: 3 .  calcium-vitamin D  (OSCAL) 250-125 MG-UNIT per tablet, Take 1 tablet by mouth daily. , Disp: , Rfl:  .  carisoprodol (SOMA) 350 MG tablet, Take 1 tablet (350 mg total) by mouth daily as needed for muscle spasms., Disp: 90 tablet, Rfl: 2 .  gabapentin (NEURONTIN) 600 MG tablet, Take 1 tablet (600 mg total) by mouth 3 (three) times daily., Disp: 270 tablet, Rfl: 3 .  hydrochlorothiazide (HYDRODIURIL) 25 MG tablet, Take 1 tablet (25 mg total) by mouth daily., Disp: 30 tablet, Rfl: 0 .  lisinopril (PRINIVIL,ZESTRIL) 10 MG tablet, TAKE 1 TABLET (10 MG TOTAL) BY MOUTH DAILY., Disp: , Rfl:  .  metroNIDAZOLE (METROGEL) 1 % gel, Apply 1 application topically daily. , Disp: , Rfl:  .  Multiple Vitamin (THERA) TABS, Take 1 tablet by mouth., Disp: , Rfl:  .  Omega-3 Fatty Acids (FISH OIL) 500 MG CAPS, Take 1 capsule by mouth daily. , Disp: , Rfl:  .  REBIF 22 MCG/0.5ML SOSY, INJECT (22MCG) SUBCUTANEOUSLY THREE  TIMES PER WEEK -REFRIGERATEDO NOTFREEZE, Disp: 36 Syringe, Rfl: 3  PAST MEDICAL HISTORY: Past Medical History:  Diagnosis Date  . Benign brain tumor ([place])   . Chronic sinusitis   . Hearing loss   . Hypertension   . Multiple sclerosis ([place])   . Raynaud disease   . Vision abnormalities     PAST SURGICAL HISTORY: Past Surgical History:  Procedure Laterality Date  . BRAIN TUMOR EXCISION    . hystrectomy    . NASAL SINUS SURGERY      FAMILY HISTORY: Family History  Problem Relation Age of Onset  . Healthy Mother   . Lung cancer Father     SOCIAL HISTORY:  Social History   Social History  . Marital status: Married    Spouse name: N/A  . Number of children: N/A  . Years of education: N/A   Occupational History  . Not on file.   Social History Main Topics  . Smoking status: Never Smoker  . Smokeless tobacco: Never Used  . Alcohol use No  . Drug use: No  . Sexual activity: Not on file   Other Topics Concern  . Not on file   Social History Narrative  . No narrative on file     PHYSICAL  EXAM  Vitals:   05/10/16 0956  BP: 126/82  Pulse: 68  Resp: 16  Weight: 141 lb 8 oz (64.2 kg)  Height: 5\' 6"  (1.676 m)    Body mass index is 22.84 kg/m.   General: The patient is well-developed and well-nourished and in no acute distress  Skin: Extremities are without rash or significant edema.  Musculoskeletal:   Tender over right trochanteric bursitis and SI joint  Neurologic Exam  Mental status: The patient is alert and oriented x 3 at the time of the examination. The patient has apparent normal recent and remote memory, with an apparently normal attention span and concentration ability.   Speech is normal.  Cranial nerves: Extraocular movements are full.  Colors are symmetric.  Facial symmetry is present. There is good facial sensation to soft touch bilaterally.Facial strength is normal.  Trapezius and sternocleidomastoid strength is normal. No dysarthria is noted.  The tongue is midline, and the patient has symmetric elevation of the soft palate. No obvious hearing deficits are noted.  Motor:  Muscle bulk is normal.   Tone is slightly increased in legs. Strength is  5 / 5 in all 4 extremities.   Sensory: Sensory testing is intact to temp, soft touch and vibration sensation in all 4 extremities.  Coordination: Cerebellar testing reveals good finger-nose-finger and slightly reduced heel-to-shin bilaterally.  Gait and station: Station is normal.   Gait is minimally wide and she has a mild right foot drop/.   Tandem gait is moderately wide. Romberg is negative.   Reflexes: Deep tendon reflexes are symmetric and normal bilaterally.                             DIAGNOSTIC DATA (LABS, IMAGING, TESTING) - I reviewed patient records, labs, notes, testing and imaging myself where available.   About 61 pages of notes from [facility] neurology were reviewed including MRI reports and lab reports.       ASSESSMENT AND PLAN  Multiple sclerosis, relapsing-remitting  ([facility])  Meningioma ([facility])  Ataxic gait  Other fatigue  Meniere's disease of left ear  Trochanteric bursitis, right hip    1.   Continue Rebif  22 mcg tiw.  She gets bloodwork at PCP once a year.    2.   Continue baclofen add cyclobenzaprine.     3.   Continue other med's,  4.    Be active.   If trochanteric bursa pain worsens can do Medrol dosepak and injection if worsens more. 5 .  She will return to see me in 6 months or sooner if she has new or worsening neurologic symptoms.   Dorothy Vandehei A. Felecia Shelling, MD, PhD [id number], [id number] AM Certified in Neurology, Clinical Neurophysiology, Sleep Medicine, Pain Medicine and Neuroimaging  [facility] [address] [place], [place] [address] [phone]

## 2016-05-10 NOTE — Telephone Encounter (Signed)
Dorothy Meyer/[facility] [phone] called said she needs to know if methylPREDNISolone (MEDROL) 4 MG tablet is for dose pack or RX, she said it does not specify.

## 2016-05-10 NOTE — Telephone Encounter (Signed)
I have spoken with Stanton Kidney at [facility] and advised that rx. is for the dose pk/fim

## 2016-05-27 ENCOUNTER — Telehealth: Payer: Self-pay | Admitting: Neurology

## 2016-05-27 NOTE — Telephone Encounter (Signed)
I have spoken with the pharmacy and clarified Rebif rx. (44mcg sq 3 times weekly #36 with 3 additional r/f)/fim

## 2016-05-27 NOTE — Telephone Encounter (Signed)
Psyche (Dorothy Meyer) Dorothy Meyer [phone] needs clarification on   REBIF 22 MCG/0.5ML SOSY

## 2016-07-09 ENCOUNTER — Other Ambulatory Visit: Payer: Self-pay | Admitting: Neurology

## 2016-10-13 ENCOUNTER — Encounter: Payer: Self-pay | Admitting: Neurology

## 2016-10-13 ENCOUNTER — Ambulatory Visit (INDEPENDENT_AMBULATORY_CARE_PROVIDER_SITE_OTHER): Payer: Medicare Other | Admitting: Neurology

## 2016-10-13 VITALS — BP 134/90 | HR 78 | Resp 14 | Ht 66.0 in | Wt 141.0 lb

## 2016-10-13 DIAGNOSIS — G35 Multiple sclerosis: Secondary | ICD-10-CM | POA: Diagnosis not present

## 2016-10-13 DIAGNOSIS — R3911 Hesitancy of micturition: Secondary | ICD-10-CM | POA: Diagnosis not present

## 2016-10-13 DIAGNOSIS — R208 Other disturbances of skin sensation: Secondary | ICD-10-CM | POA: Insufficient documentation

## 2016-10-13 DIAGNOSIS — M549 Dorsalgia, unspecified: Secondary | ICD-10-CM | POA: Diagnosis not present

## 2016-10-13 DIAGNOSIS — H8102 Meniere's disease, left ear: Secondary | ICD-10-CM | POA: Diagnosis not present

## 2016-10-13 DIAGNOSIS — R260 Ataxic gait: Secondary | ICD-10-CM | POA: Diagnosis not present

## 2016-10-13 MED ORDER — ALPRAZOLAM 0.5 MG PO TABS: 0.5000 mg | ORAL_TABLET | Freq: Every evening | ORAL | 5 refills | Status: DC | PRN

## 2016-10-13 NOTE — Progress Notes (Signed)
[facility]  PATIENT: Dorothy Meyer DOB: 05/11/1951  REFERRING DOCTOR OR PCP:  Scarlette Slice  SOURCE: Patient  _________________________________   HISTORICAL  CHIEF COMPLAINT:  Chief Complaint  Patient presents with  . Multiple Sclerosis    Pt. c/o sudden onset increased spasticity, numbness, electric jolt sensation last night.  Onset this morning of numbness from posterior waist to upper back, bilat shoulders.  Denies missed doses of Rebif.  Sts. she has been under more stress the last couple of months, with increased travel, passing of her mother-in-law./fim  . Refill Needed    Needs r/f of Alprazolam/fim    HISTORY OF PRESENT ILLNESS:  Dorothy Meyer is a 66 year old woman with MS.   She has more symptoms x 2 days and is concerned about An MS exacerbation.    She is getting electric shocks sensation behind her legs an a tight sensation in the thorax up to the shoulders.,    She has numbness in her hands which is old.   She notes more spasticity in her legs..  Additionally, she noted urinary hesitancy.     She is on gabapentin 600 mg po tid and is also on baclofen.      MS:   She is on Rebif 22 mcg and tolerates it well.   Before the current episode, she had not had any recent exacerbations. She reports fatigue, mild gait disturbance and cognitive difficulties are stable  Gait:  She mild weakness in the right leg and she has a foot drop if she walks longer.. She has not needed a cane recently.   Muscles was tire out easily.  She gets some spasticity, helped by soma and baclofen.   Medicare no longer covers peripheral muscle relaxants and Soma is expensive.    Vision:  Vision is stable.  Her eyes were recently evaluated by ophtho.    In the past, she has had right optic neuritis in 1996.   No diplopia.  Vertigo/Meniere's:  Her  Meniere's is doing very well on HCTZ 25 mg with no recent exacerbation.  She has no more episodes wince starting HCTZ.    She  presented with severe vertigo (non-positional) in early 2016, that worsened 43/2016.    She had N/V that was severe and balance was so off that she could barely walk during an attack.     Bladder/bowel:   She denies any significant urinary frequency or urgency but has noted some hesitancy.   Constipation is better with ecercise or prune juice  Fatigue/sleep:  She is fine almost every morning but very tired in the afternoon.   She notes physical more than cognitive fatigue. Marland Kitchen Provigil and Ritalin had not helped in the past. She sometimes naps and feels better afterwards.    Mood dysfunction:   She denies depression but has anxiety.   She takes Xanax at night anxiety and sleep.. She remains on Wellbutrin and tolerates it well.    Cognitive dysfunction:  She has mild difficulty with focus, attention, verbal fluency and multitasking. These symptoms are stable.  If she has to do more cognitively one day, she feels cognitively wiped out the next day.       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  She sees at [facility] for this. Her last MRI 10/2015 was stable  MS History:    She was diagnosed with multiple sclerosis in the 1997 after  she presented with numbness in both arms. An MRI of the cervical spine showed a plaque at C2C3.   An MRI of the brain at that time showed other lesions, compatible with the diagnosis of multiple sclerosis. She had only 3 plaques in the brain initially and treatment was deferred. However, one year later she had optic neuritis, confirmed the diagnosis of multiple sclerosis. Initially, she was placed on Avonex. She then tried Copaxonelater on as she was having flulike symptoms with Avonex. However, she had severe skin reactions with Copaxone that was stopped. She then went on Rebif. She has been on Rebif for the past 8 years or so and tolerates it very well. Her MS has been mostly stable while on the Rebif.  Last exacerbation was 2015.   Her Rebif dose is 22 g. She tolerates that dose well. Her last MRI of the brain was in 2015 and did not show any new lesions.  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 On the cervical spine MRI she has 3 hyperintense foci consistent with demyelinating plaque.    REVIEW OF SYSTEMS: Constitutional: No fevers, chills, sweats, or change in appetite.   Fatigue is stable Eyes: No visual changes, double vision, eye pain Ear, nose and throat: No hearing loss, ear pain, nasal congestion, sore throat Cardiovascular: No chest pain, palpitations Respiratory: No shortness of breath at rest or with exertion.   No wheezes GastrointestinaI: No nausea, vomiting, diarrhea, abdominal pain, fecal incontinence Genitourinary: No dysuria, urinary retention or frequency.  No nocturia. Musculoskeletal: No neck pain, back pain Integumentary: No rash, pruritus, skin lesions Neurological: as above Psychiatric: No current depression at this time.  No anxiety Endocrine: No palpitations, diaphoresis, change in appetite, change in weigh or increased thirst Hematologic/Lymphatic: No anemia, purpura, petechiae. Allergic/Immunologic: No itchy/runny eyes, nasal congestion, recent allergic reactions, rashes  ALLERGIES: Allergies  Allergen Reactions  . Hydrocodone-Acetaminophen Nausea And Vomiting  . Oxycodone Nausea And Vomiting  . Propoxyphene Nausea And Vomiting  . Tramadol Nausea And Vomiting    HOME MEDICATIONS:  Current Outpatient Prescriptions:  .  albuterol (PROVENTIL HFA;VENTOLIN HFA) 108 (90 BASE) MCG/ACT inhaler, Inhale 2 puffs into the lungs every 4 (four) hours as needed for wheezing or shortness of breath. , Disp: , Rfl:  .  ALPRAZolam (XANAX) 0.5 MG tablet, Take 1 tablet (0.5 mg total) by mouth at bedtime as needed for anxiety., Disp: 30 tablet, Rfl: 5 .  baclofen (LIORESAL) 10 MG tablet, Take 1 tablet (10 mg total) by  mouth 4 (four) times daily., Disp: 360 tablet, Rfl: 3 .  budesonide (PULMICORT) 1 MG/2ML nebulizer solution, Take 1 mg by nebulization daily as needed (shortness of breath). , Disp: , Rfl: 10 .  buPROPion (WELLBUTRIN XL) 150 MG 24 hr tablet, Take 1 tablet (150 mg total) by mouth daily., Disp: 90 tablet, Rfl: 3 .  calcium-vitamin D (OSCAL) 250-125 MG-UNIT per tablet, Take 1 tablet by mouth daily. , Disp: , Rfl:  .  cyclobenzaprine (FLEXERIL) 5 MG tablet, Take 1 tablet (5 mg total) by mouth 3 (three) times daily., Disp: 90 tablet, Rfl: 3 .  gabapentin (NEURONTIN) 600 MG tablet, TAKE 1 TABLET BY MOUTH 3  TIMES DAILY, Disp: 270 tablet, Rfl: 3 .  hydrochlorothiazide (HYDRODIURIL) 25 MG tablet, Take 1 tablet (25 mg total) by mouth daily., Disp: 30 tablet, Rfl: 0 .  lisinopril (PRINIVIL,ZESTRIL) 10 MG tablet, TAKE 1 TABLET (10 MG TOTAL) BY MOUTH DAILY., Disp: ,  Rfl:  .  metroNIDAZOLE (METROGEL) 1 % gel, Apply 1 application topically daily. , Disp: , Rfl:  .  Multiple Vitamin (THERA) TABS, Take 1 tablet by mouth., Disp: , Rfl:  .  Omega-3 Fatty Acids (FISH OIL) 500 MG CAPS, Take 1 capsule by mouth daily. , Disp: , Rfl:  .  REBIF 22 MCG/0.5ML SOSY, INJECT (22MCG) SUBCUTANEOUSLY THREE TIMES PER WEEK -REFRIGERATEDO NOTFREEZE, Disp: 36 Syringe, Rfl: 3 .  carisoprodol (SOMA) 350 MG tablet, Take 1 tablet (350 mg total) by mouth daily as needed for muscle spasms. (Patient not taking: Reported on 10/13/2016), Disp: 90 tablet, Rfl: 2 .  methylPREDNISolone (MEDROL) 4 MG tablet, Take 1 tablet (4 mg total) by mouth daily. (Patient not taking: Reported on 10/13/2016), Disp: 21 tablet, Rfl: 0  PAST MEDICAL HISTORY: Past Medical History:  Diagnosis Date  . Benign brain tumor ([place])   . Chronic sinusitis   . Hearing loss   . Hypertension   . Multiple sclerosis ([place])   . Raynaud disease   . Vision abnormalities     PAST SURGICAL HISTORY: Past Surgical History:  Procedure Laterality Date  . BRAIN TUMOR EXCISION      . hystrectomy    . NASAL SINUS SURGERY      FAMILY HISTORY: Family History  Problem Relation Age of Onset  . Healthy Mother   . Lung cancer Father     SOCIAL HISTORY:  Social History   Social History  . Marital status: Married    Spouse name: N/A  . Number of children: N/A  . Years of education: N/A   Occupational History  . Not on file.   Social History Main Topics  . Smoking status: Never Smoker  . Smokeless tobacco: Never Used  . Alcohol use No  . Drug use: No  . Sexual activity: Not on file   Other Topics Concern  . Not on file   Social History Narrative  . No narrative on file     PHYSICAL EXAM  Vitals:   10/13/16 1016  BP: 134/90  Pulse: 78  Resp: 14  Weight: 141 lb (64 kg)  Height: 5\' 6"  (1.676 m)    Body mass index is 22.76 kg/m.   General: The patient is well-developed and well-nourished and in no acute distress  Skin: Extremities are without rash or edema.   Neurologic Exam  Mental status: The patient is alert and oriented x 3 at the time of the examination. The patient has apparent normal recent and remote memory, with an apparently normal attention span and concentration ability.   Speech is normal.  Cranial nerves: Extraocular movements are full.   There is good facial sensation to soft touch bilaterally.Facial strength is normal.  Trapezius and sternocleidomastoid strength is normal. No dysarthria is noted.  The tongue is midline, and the patient has symmetric elevation of the soft palate. No obvious hearing deficits are noted.  Motor:  Muscle bulk is normal.   Tone is slightly increased in legs. Strength is  5 / 5 in all 4 extremities.   Sensory: Sensory testing is intact to temp, soft touch and vibration sensation in all 4 extremities.  Coordination: Cerebellar testing reveals good finger-nose-finger and mildly reduced heel-to-shin bilaterally.  Gait and station: Station is normal.   Her gait is mildly wide and she has a mild  right foot drop.Marland Kitchen  Her tandem gait is wide. Romberg is negative.   Reflexes: Deep tendon reflexes are symmetric and normal in arms and  milly increased at knees .                             DIAGNOSTIC DATA (LABS, IMAGING, TESTING) - I reviewed patient records, labs, notes, testing and imaging myself where available.   About 2 pages of notes from [facility] neurology were reviewed including MRI reports and lab reports.       ASSESSMENT AND PLAN  Multiple sclerosis, relapsing-remitting (HCC)  Multiple sclerosis (HCC)  Ataxic gait  Urinary hesitancy  Dysesthesia  Meniere's disease of left ear  Upper back pain   1.   Continue Rebif 22 mcg tiw for now.  3 days IV Solumedrol 1000 mg for possible relapse.  We need to check MRI cervical spine and thoracic spine due to upper back pain, bilateral neurologic symptoms and bladder hesitancy (new) to determine if issues due to MS or to disc or other compressive pathology.      2.   Continue baclofen and temporarily increase to 60 mg.day.     3.   Increase gabapentin to 600 mg qid,  4.   She will return to see me in 2-3 months or sooner if she has new or worsening neurologic symptoms.  40 minutes face-to-face evaluation with greater than one half at time counseling or coordinating care about her multiple sclerosis, recent symptoms/exacerbation and disease modifying therapy options.   Kaybree Williams A. Felecia Shelling, MD, PhD [id number], [id number] AM Certified in Neurology, Clinical Neurophysiology, Sleep Medicine, Pain Medicine and Neuroimaging  [facility] [address] [place], [place] [address] [phone]

## 2016-11-04 ENCOUNTER — Ambulatory Visit
Admission: RE | Admit: 2016-11-04 | Discharge: 2016-11-04 | Disposition: A | Discharge status: Home / Self Care | Payer: Medicare Other | Source: Ambulatory Visit | Attending: Neurology | Admitting: Neurology

## 2016-11-04 DIAGNOSIS — R3911 Hesitancy of micturition: Secondary | ICD-10-CM

## 2016-11-04 DIAGNOSIS — G35 Multiple sclerosis: Secondary | ICD-10-CM | POA: Diagnosis not present

## 2016-11-04 DIAGNOSIS — R208 Other disturbances of skin sensation: Secondary | ICD-10-CM

## 2016-11-04 DIAGNOSIS — M549 Dorsalgia, unspecified: Secondary | ICD-10-CM

## 2016-11-04 MED ORDER — GADOBENATE DIMEGLUMINE 529 MG/ML IV SOLN: 15.0000 mL | Freq: Once | INTRAVENOUS | Status: DC | PRN

## 2016-11-08 ENCOUNTER — Ambulatory Visit: Payer: Medicare Other | Admitting: Neurology

## 2016-11-09 ENCOUNTER — Telehealth: Payer: Self-pay | Admitting: Neurology

## 2016-11-10 ENCOUNTER — Telehealth: Payer: Self-pay | Admitting: Neurology

## 2016-11-10 NOTE — Telephone Encounter (Signed)
Patient called office in reference to receiving MRI results.  Please call

## 2016-11-10 NOTE — Telephone Encounter (Signed)
I spoke to [place]. I reviewed the MRI of the cervical spine showing 2 lesions adjacent to C2 and adjacent to C2-C3. 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 We also discussed her mild spinal stenosis. She does not appear to have spinal cord compression so I would not recommend surgical intervention but this could cause her more problems in the future.  For the time being she will stay on Rebif. However, if she has another exacerbation she would consider a switch to a different medication.

## 2016-11-10 NOTE — Telephone Encounter (Signed)
I have spoken with Dorothy Meyer this afternoon and reviewed MRI C and T-spine reports with her.  She verbalized understanding of same/fim

## 2017-01-02 ENCOUNTER — Other Ambulatory Visit: Payer: Self-pay | Admitting: Neurology

## 2017-01-19 ENCOUNTER — Ambulatory Visit: Payer: Medicare Other | Admitting: Neurology

## 2017-02-19 ENCOUNTER — Other Ambulatory Visit: Payer: Self-pay | Admitting: Neurology

## 2017-03-04 ENCOUNTER — Encounter (INDEPENDENT_AMBULATORY_CARE_PROVIDER_SITE_OTHER): Payer: Self-pay

## 2017-03-04 ENCOUNTER — Encounter: Payer: Self-pay | Admitting: Neurology

## 2017-03-04 ENCOUNTER — Ambulatory Visit (INDEPENDENT_AMBULATORY_CARE_PROVIDER_SITE_OTHER): Payer: Medicare Other | Admitting: Neurology

## 2017-03-04 VITALS — BP 107/77 | HR 74 | Resp 16 | Ht 66.0 in | Wt 149.0 lb

## 2017-03-04 DIAGNOSIS — G35 Multiple sclerosis: Secondary | ICD-10-CM

## 2017-03-04 DIAGNOSIS — D329 Benign neoplasm of meninges, unspecified: Secondary | ICD-10-CM

## 2017-03-04 DIAGNOSIS — R260 Ataxic gait: Secondary | ICD-10-CM

## 2017-03-04 DIAGNOSIS — R3911 Hesitancy of micturition: Secondary | ICD-10-CM

## 2017-03-04 DIAGNOSIS — R5383 Other fatigue: Secondary | ICD-10-CM | POA: Diagnosis not present

## 2017-03-04 MED ORDER — CARISOPRODOL 350 MG PO TABS: 350.0000 mg | ORAL_TABLET | Freq: Three times a day (TID) | ORAL | 5 refills | Status: DC | PRN

## 2017-03-04 NOTE — Progress Notes (Signed)
[facility]  PATIENT: Dorothy Meyer DOB: 1950/10/22  REFERRING DOCTOR OR PCP:  Scarlette Slice  SOURCE: Patient  _________________________________   HISTORICAL  CHIEF COMPLAINT:  Chief Complaint  Patient presents with  . Multiple Sclerosis    Sts. she continues to tolerate Rebif well.  Denies new or worsening sx. at this time/fim    HISTORY OF PRESENT ILLNESS:  Dorothy Meyer is a 66 year old woman with MS.      MS:   She is on Rebif 22 mcg and tolerates it well.   She had an exacerbation earlier this year involving her legs.   Before the current episode, she had not had any recent exacerbations. She reports fatigue, mild gait disturbance and cognitive difficulties are stable  Gait:  Gait improved after the steroid IV dose and she is walking 2 miles a day.   The right leg is mildly weaker.  She no longer needs a cane.   She fatigues easily, especially in heat.   She has spasticity and takes baclofen and gabapentin.   Sometimes she will also take soma.  She gets some spasticity, helped by soma and baclofen.   Medicare no longer covers peripheral muscle relaxants and Soma is expensive.    Vision:  Vision is stable.  Her eyes were recently evaluated by ophtho.    In the past, she has had right optic neuritis in 1996.   No diplopia.  Vertigo/Meniere's:  She is having more trouble with her hearing and vertigo.    She is seeing (Dr. Vinetta Bergamo).  For a while, the vertigo / possible  Meniere's is doing very well on HCTZ 25 mg with no recent severe exacerbation.  However, her hearing loss seems worse.     She presented with severe vertigo (non-positional) in early 2016, that worsened 43/2016.    She had N/V that was severe and balance was so off that she could barely walk during an attack.     Bladder/bowel:   She has noted mild urinary hesitancy. The urinary frequency has resolved. Constipation is no longer a problem with change in activity and diet.   Fatigue/sleep;    She notes fatigue most afternoons but does better in the mornings. She does worse with heat. She notes physical more than cognitive fatigue. Marland Kitchen Provigil and Ritalin had not helped in the past. She sometimes naps and feels better afterwards.    Mood dysfunction:    mood is currently doing well. She denies depression and anxiety is minimal..  She takes Xanax at night anxiety and sleep.. She remains on Wellbutrin and tolerates it well.    Cognitive dysfunction:  She has mild difficulty with focus, attention, verbal fluency and multitasking. These symptoms are stable.  If she has to do more cognitively one day, she feels cognitively wiped out the next day.       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  She sees at [facility] for this. Her last MRI 10/2015 was stable.   She has had gamma knofe.    MS History:    She was diagnosed with multiple sclerosis in the 1997 after she presented with numbness in both arms. An MRI of the cervical spine showed a plaque at C2C3.   An MRI of the brain at that time showed other lesions, compatible with the diagnosis of multiple sclerosis. She had only 3 plaques in the brain initially and treatment  was deferred. However, one year later she had optic neuritis, confirmed the diagnosis of multiple sclerosis. Initially, she was placed on Avonex. She then tried Copaxonelater on as she was having flulike symptoms with Avonex. However, she had severe skin reactions with Copaxone that was stopped. She then went on Rebif. She has been on Rebif for the past 8 years or so and tolerates it very well. Her MS has been mostly stable while on the Rebif. Last exacerbation was 2015.   Her Rebif dose is 22 g. She tolerates that dose well. Her last MRI of the brain was in 2015 and did not show any new lesions.  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 On the cervical spine  MRI she has 3 hyperintense foci consistent with demyelinating plaque.    REVIEW OF SYSTEMS: Constitutional: No fevers, chills, sweats, or change in appetite.   Fatigue is stable Eyes: No visual changes, double vision, eye pain Ear, nose and throat: No hearing loss, ear pain, nasal congestion, sore throat Cardiovascular: No chest pain, palpitations Respiratory: No shortness of breath at rest or with exertion.   No wheezes GastrointestinaI: No nausea, vomiting, diarrhea, abdominal pain, fecal incontinence Genitourinary: No dysuria, urinary retention or frequency.  No nocturia. Musculoskeletal: No neck pain, back pain Integumentary: No rash, pruritus, skin lesions Neurological: as above Psychiatric: No current depression at this time.  No anxiety Endocrine: No palpitations, diaphoresis, change in appetite, change in weigh or increased thirst Hematologic/Lymphatic: No anemia, purpura, petechiae. Allergic/Immunologic: No itchy/runny eyes, nasal congestion, recent allergic reactions, rashes  ALLERGIES: Allergies  Allergen Reactions  . Hydrocodone-Acetaminophen Nausea And Vomiting  . Oxycodone Nausea And Vomiting  . Propoxyphene Nausea And Vomiting  . Tramadol Nausea And Vomiting    HOME MEDICATIONS:  Current Outpatient Prescriptions:  .  albuterol (PROVENTIL HFA;VENTOLIN HFA) 108 (90 BASE) MCG/ACT inhaler, Inhale 2 puffs into the lungs every 4 (four) hours as needed for wheezing or shortness of breath. , Disp: , Rfl:  .  ALPRAZolam (XANAX) 0.5 MG tablet, Take 1 tablet (0.5 mg total) by mouth at bedtime as needed for anxiety., Disp: 30 tablet, Rfl: 5 .  baclofen (LIORESAL) 10 MG tablet, Take 1 tablet (10 mg total) by mouth 4 (four) times daily., Disp: 360 tablet, Rfl: 3 .  budesonide (PULMICORT) 1 MG/2ML nebulizer solution, Take 1 mg by nebulization daily as needed (shortness of breath). , Disp: , Rfl: 10 .  buPROPion (WELLBUTRIN XL) 150 MG 24 hr tablet, TAKE 1 TABLET BY MOUTH   DAILY, Disp: 90 tablet, Rfl: 3 .  gabapentin (NEURONTIN) 600 MG tablet, TAKE 1 TABLET BY MOUTH 3  TIMES DAILY, Disp: 270 tablet, Rfl: 3 .  hydrochlorothiazide (HYDRODIURIL) 25 MG tablet, TAKE 1 TABLET BY MOUTH  DAILY, Disp: 90 tablet, Rfl: 3 .  lisinopril (PRINIVIL,ZESTRIL) 10 MG tablet, TAKE 1 TABLET (10 MG TOTAL) BY MOUTH DAILY., Disp: , Rfl:  .  metroNIDAZOLE (METROGEL) 1 % gel, Apply 1 application topically daily. , Disp: , Rfl:  .  Omega-3 Fatty Acids (FISH OIL) 500 MG CAPS, Take 1 capsule by mouth daily. , Disp: , Rfl:  .  REBIF 22 MCG/0.5ML SOSY, INJECT (22MCG) SUBCUTANEOUSLY THREE TIMES PER WEEK -REFRIGERATEDO NOTFREEZE, Disp: 36 Syringe, Rfl: 3 .  carisoprodol (SOMA) 350 MG tablet, Take 1 tablet (350 mg total) by mouth 3 (three) times daily as needed for muscle spasms., Disp: 90 tablet, Rfl: 5  PAST MEDICAL HISTORY: Past Medical History:  Diagnosis Date  . Benign brain tumor ([place])   . Chronic sinusitis   . Hearing loss   . Hypertension   . Multiple sclerosis ([place])   . Raynaud disease   . Vision abnormalities     PAST SURGICAL HISTORY: Past Surgical History:  Procedure Laterality Date  . BRAIN TUMOR EXCISION    . hystrectomy    . NASAL SINUS SURGERY      FAMILY HISTORY: Family History  Problem Relation Age of Onset  . Healthy Mother   . Lung cancer Father     SOCIAL HISTORY:  Social History   Social History  . Marital status: Married    Spouse name: N/A  . Number of children: N/A  . Years of education: N/A   Occupational History  . Not on file.   Social History Main Topics  . Smoking status: Never Smoker  . Smokeless tobacco: Never Used  . Alcohol use No  . Drug use: No  . Sexual activity: Not on file   Other Topics Concern  . Not on file   Social History Narrative  . No narrative on file     PHYSICAL EXAM  Vitals:   03/04/17 0959  BP: 107/77  Pulse: 74  Resp: 16  Weight: 149 lb (67.6 kg)  Height: 5\' 6"  (1.676 m)    Body mass index is  24.05 kg/m.   General: The patient is well-developed and well-nourished and in no acute distress  Skin: Extremities are without rash or edema.   Neurologic Exam  Mental status: The patient is alert and oriented x 3 at the time of the examination. The patient has apparent normal recent and remote memory, with an apparently normal attention span and concentration ability.   Speech is normal.  Cranial nerves: Extraocular movements are full.   Patient's strength and sensation is normal. Trapezius strength is normal.he tongue is midline, and the patient has symmetric elevation of the soft palate. No obvious hearing deficits are noted.  Motor:  Muscle bulk is normal.   Tone is slightly increased in legs. Strength is  5 / 5 in all 4 extremities.   Sensory: Sensory testing is intact to temp, soft touch and vibration sensation in all 4 extremities.  Coordination: Cerebellar testshows good finger-nose-finger and heel-to-shin  ait and station: Station is normal.   She has a mild right foot drop and her gait is mildly wide. Tandem gait is wide. Romberg is negative.   Reflexes: Deep tendon reflexes are symmetric and normal in arms and milly increased at knees .                             DIAGNOSTIC DATA (LABS, IMAGING, TESTING) - I reviewed patient records, labs, notes, testing and imaging myself where available.   About 49 pages of notes from [facility] neurology were reviewed including MRI reports and lab reports.       ASSESSMENT AND PLAN  Multiple sclerosis (HCC)  Meningioma (HCC)  Ataxic gait  Other fatigue  Urinary hesitancy   1.    she will continue Rebif 22 g 3 times a week.  Labwork done recently at PCP (slight LFT elevation).      2.   Continue baclofen and add prn soma.     3.   Continue gabapentin to 600 mg qid,  4.   She will return to see me in 6 months or sooner if she has new  or worsening neurologic symptoms.    Seylah Wernert A. Felecia Shelling, MD, PhD 11/04/486, 8:91  PM Certified in Neurology, Clinical Neurophysiology, Sleep Medicine, Pain Medicine and Neuroimaging  [facility] [address] [place], [place] [address] [phone]

## 2017-04-05 ENCOUNTER — Telehealth: Payer: Self-pay | Admitting: Neurology

## 2017-04-05 NOTE — Telephone Encounter (Signed)
Patient called back and questioned if she could bring in her new DMV handicap form for Dr Felecia Shelling to sign and then have it mailed back to her. This RN advised her it can be mailed back to her. She then stated she saw her PCP who asked her about the shingles shot. PCP told her that there is a new shingles shot that is not a live virus. The patient would like Dr Garth Bigness opinion about whether he recommended she take it.  She has had shingles once and wants his opinion before taking the shot. She stated she will have a note in with the handicap form that Dr Felecia Shelling can reply re: shingles shot.  This RN advised her that would be fine. Patient verbalized understanding, appreciation.

## 2017-04-05 NOTE — Telephone Encounter (Signed)
LVM requesting a call back.

## 2017-04-05 NOTE — Telephone Encounter (Signed)
Pt asking for a call back re: questions she has about the Shingles shot and a handicap form that Dr Felecia Shelling needs to sign off on for her.  Please call

## 2017-04-07 ENCOUNTER — Telehealth: Payer: Self-pay | Admitting: Neurology

## 2017-04-07 NOTE — Telephone Encounter (Signed)
Okay to get the non-live virus shingles vaccination

## 2017-04-07 NOTE — Telephone Encounter (Signed)
Message sent to Dr.Sater for his opinion on non live virus for shingles vaccination.    PT said she found a Shingles vaccine containing non-live virus, wants to know Sater's opinion on it.. She said she is told she can't do the one with live virus since she has MS. (Routing comment)

## 2017-04-07 NOTE — Telephone Encounter (Signed)
Rn call patient that per Dr .Felecia Shelling she can have the non virus shingles. Pt states she has had shingles before and wanted to make sure because of her MS. Pt verbalized understanding.

## 2017-04-19 ENCOUNTER — Other Ambulatory Visit: Payer: Self-pay | Admitting: Neurology

## 2017-05-09 ENCOUNTER — Other Ambulatory Visit: Payer: Self-pay | Admitting: Neurology

## 2017-05-10 ENCOUNTER — Other Ambulatory Visit: Payer: Self-pay | Admitting: Neurology

## 2017-08-06 ENCOUNTER — Other Ambulatory Visit: Payer: Self-pay | Admitting: Neurology

## 2017-08-17 ENCOUNTER — Telehealth: Payer: Self-pay | Admitting: Neurology

## 2017-08-17 MED ORDER — TRAZODONE HCL 100 MG PO TABS: 100.0000 mg | ORAL_TABLET | Freq: Every day | ORAL | 0 refills | Status: DC

## 2017-08-17 NOTE — Addendum Note (Signed)
Addended by: France Ravens I on: 08/17/2017 04:15 PM   Modules accepted: Orders

## 2017-08-17 NOTE — Telephone Encounter (Signed)
Spoke with Anda Kraft and advised ok per RAS to stop Xanax, try Trazodone for sleep.  For the first few nights, just take 1/2 tablet. She verbalized understanding of same.  Rx. escribed to [facility] per her request/fim

## 2017-08-17 NOTE — Telephone Encounter (Signed)
Pt calling she is not sleeping at night. She has a friend that has MS and takes trazodone and does very well. She is wanting to know if she can try this instead of the xanax at night. Pt also said she had an allergic reaction to tramadol, she wants to make sure they are not in the same family. Pharmacy: [facility]. She advised a msg can be left on VM

## 2017-09-09 ENCOUNTER — Other Ambulatory Visit: Payer: Self-pay

## 2017-09-09 ENCOUNTER — Encounter: Payer: Self-pay | Admitting: Neurology

## 2017-09-09 ENCOUNTER — Ambulatory Visit: Payer: Medicare Other | Admitting: Neurology

## 2017-09-09 VITALS — BP 136/78 | HR 84 | Resp 14 | Ht 66.0 in | Wt 148.5 lb

## 2017-09-09 DIAGNOSIS — R5383 Other fatigue: Secondary | ICD-10-CM | POA: Diagnosis not present

## 2017-09-09 DIAGNOSIS — R260 Ataxic gait: Secondary | ICD-10-CM

## 2017-09-09 DIAGNOSIS — R208 Other disturbances of skin sensation: Secondary | ICD-10-CM

## 2017-09-09 DIAGNOSIS — G35A Relapsing-remitting multiple sclerosis: Secondary | ICD-10-CM

## 2017-09-09 DIAGNOSIS — R413 Other amnesia: Secondary | ICD-10-CM | POA: Diagnosis not present

## 2017-09-09 DIAGNOSIS — H918X2 Other specified hearing loss, left ear: Secondary | ICD-10-CM

## 2017-09-09 DIAGNOSIS — T148XXA Other injury of unspecified body region, initial encounter: Secondary | ICD-10-CM

## 2017-09-09 DIAGNOSIS — D329 Benign neoplasm of meninges, unspecified: Secondary | ICD-10-CM | POA: Diagnosis not present

## 2017-09-09 DIAGNOSIS — IMO0001 Reserved for inherently not codable concepts without codable children: Secondary | ICD-10-CM

## 2017-09-09 DIAGNOSIS — G35 Multiple sclerosis: Secondary | ICD-10-CM | POA: Diagnosis not present

## 2017-09-09 NOTE — Progress Notes (Signed)
[facility]  PATIENT: Dorothy Meyer DOB: 08-29-1951  REFERRING DOCTOR OR PCP:  Scarlette Slice  SOURCE: Patient  _________________________________   HISTORICAL  CHIEF COMPLAINT:  Chief Complaint  Patient presents with  . Multiple Sclerosis    Sts. she continues to tolerate Rebif well. Continues to have trouble staying asleep.  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      Update 09/09/2017: Her MS has been stable. She tolerates Rebif 22 mcg well but wonders if it contributes to easy bruising. She denies any exacerbation he tolerates Rebif well. She had blood work 02/15/2017 showing slightly elevated liver function tests.  She notes choking some while chewing but not swallowing.    Gait is fine.  Strength is good but she tires out easily.   She tries to go to the gym some.  Bladder is doing well.     She gets an icepick sensation around her upper truncal muscles .   T-spine MRI was benign with just mild DJD at T4 and T6-T12 but no foraminal narrowing or disc herniations.    She had more stress over the families with more fatigue and worse sleep.   She often wakes up at 1 am.   If she wakes up, she often takes a Xanax.    If she sleeps poorly she is less functional the next day.    Trazodone was poorly tolerated.    At 5 pm, she takes gabapentin and baclofen but no med's at night.     A couple years ago, she had a lot of vertigo related to high salt intake and HCTZ was started.  She now uses less salt and we discussed stopping it.        From 03/04/2017: MS:   She is on Rebif 22 mcg and tolerates it well.   She had an exacerbation earlier this year involving her legs.   Before the current episode, she had not had any recent exacerbations. She reports  fatigue, mild gait disturbance and cognitive difficulties are stable  Gait:  Gait improved after the steroid IV dose and she is walking 2 miles a day.   The right leg is mildly weaker.  She no longer needs a cane.   She fatigues easily, especially in heat.   She has spasticity and takes baclofen and gabapentin.   Sometimes she will also take soma.  She gets some spasticity, helped by soma and baclofen.   Medicare no longer covers peripheral muscle relaxants and Soma is expensive.    Vision:  Vision is stable.  Her eyes were recently evaluated by ophtho.    In the past, she has had right optic neuritis in 1996.   No diplopia.  Vertigo/Meniere's:  She is having more trouble with her hearing and vertigo.    She is seeing (Dr. Vinetta Bergamo).  For a while, the vertigo / possible  Meniere's is doing very well on HCTZ 25 mg with no recent severe exacerbation.  However, her hearing loss seems worse.     She presented with severe vertigo (non-positional) in early 2016, that worsened 43/2016.    She had N/V that was severe and balance was so off that she could barely walk during an attack.  Bladder/bowel:   She has noted mild urinary hesitancy. The urinary frequency has resolved. Constipation is no longer a problem with change in activity and diet.   Fatigue/sleep;   She notes fatigue most afternoons but does better in the mornings. She does worse with heat. She notes physical more than cognitive fatigue. Marland Kitchen Provigil and Ritalin had not helped in the past. She sometimes naps and feels better afterwards.    Mood dysfunction:    mood is currently doing well. She denies depression and anxiety is minimal..  She takes Xanax at night anxiety and sleep.. She remains on Wellbutrin and tolerates it well.    Cognitive dysfunction:  She has mild difficulty with focus, attention, verbal fluency and multitasking. These symptoms are stable.  If she has to do more cognitively one day, she feels cognitively wiped out the next day.        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  She sees at [facility] for this. Her last MRI 10/2015 was stable.   She has had gamma knofe.    MS History:    She was diagnosed with multiple sclerosis in the 1997 after she presented with numbness in both arms. An MRI of the cervical spine showed a plaque at C2C3.   An MRI of the brain at that time showed other lesions, compatible with the diagnosis of multiple sclerosis. She had only 3 plaques in the brain initially and treatment was deferred. However, one year later she had optic neuritis, confirmed the diagnosis of multiple sclerosis. Initially, she was placed on Avonex. She then tried Copaxonelater on as she was having flulike symptoms with Avonex. However, she had severe skin reactions with Copaxone that was stopped. She then went on Rebif. She has been on Rebif for the past 8 years or so and tolerates it very well. Her MS has been mostly stable while on the Rebif. Last exacerbation was 2015.   Her Rebif dose is 22 g. She tolerates that dose well. Her last MRI of the brain was in 2015 and did not show any new lesions.  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 On the cervical spine MRI she has 3 hyperintense foci consistent with demyelinating plaque.    REVIEW OF SYSTEMS: Constitutional: No fevers, chills, sweats, or change in appetite.   Fatigue is stable Eyes: No visual changes, double vision, eye pain Ear, nose and throat: No hearing loss, ear pain, nasal congestion, sore throat Cardiovascular: No chest pain, palpitations Respiratory: No shortness of breath at rest or with exertion.   No wheezes GastrointestinaI: No nausea, vomiting, diarrhea, abdominal pain, fecal incontinence Genitourinary: No dysuria, urinary retention or frequency.  No nocturia. Musculoskeletal: No neck pain, back pain Integumentary: No rash,  pruritus, skin lesions Neurological: as above Psychiatric: No current depression at this time.  No anxiety Endocrine: No palpitations, diaphoresis, change in appetite, change in weigh or increased thirst Hematologic/Lymphatic: No anemia, purpura, petechiae. Allergic/Immunologic: No itchy/runny eyes, nasal congestion, recent allergic reactions, rashes  ALLERGIES: Allergies  Allergen Reactions  . Hydrocodone-Acetaminophen Nausea And Vomiting  . Oxycodone Nausea And Vomiting  . Propoxyphene Nausea And Vomiting  . Tramadol Nausea And Vomiting    HOME MEDICATIONS:  Current Outpatient Medications:  .  albuterol (PROVENTIL HFA;VENTOLIN HFA) 108 (90 BASE) MCG/ACT inhaler, Inhale 2 puffs into the lungs every 4 (four) hours as needed for  wheezing or shortness of breath. , Disp: , Rfl:  .  baclofen (LIORESAL) 10 MG tablet, TAKE 1 TABLET BY MOUTH 4  TIMES DAILY, Disp: 360 tablet, Rfl: 3 .  budesonide (PULMICORT) 1 MG/2ML nebulizer solution, Take 1 mg by nebulization daily as needed (shortness of breath). , Disp: , Rfl: 10 .  buPROPion (WELLBUTRIN XL) 150 MG 24 hr tablet, TAKE 1 TABLET BY MOUTH  DAILY, Disp: 90 tablet, Rfl: 3 .  carisoprodol (SOMA) 350 MG tablet, Take 1 tablet (350 mg total) by mouth 3 (three) times daily as needed for muscle spasms., Disp: 90 tablet, Rfl: 5 .  gabapentin (NEURONTIN) 600 MG tablet, TAKE 1 TABLET BY MOUTH 3  TIMES DAILY, Disp: 270 tablet, Rfl: 3 .  hydrochlorothiazide (HYDRODIURIL) 25 MG tablet, TAKE 1 TABLET BY MOUTH  DAILY, Disp: 90 tablet, Rfl: 3 .  lisinopril (PRINIVIL,ZESTRIL) 10 MG tablet, TAKE 1 TABLET (10 MG TOTAL) BY MOUTH DAILY., Disp: , Rfl:  .  metroNIDAZOLE (METROGEL) 1 % gel, Apply 1 application topically daily. , Disp: , Rfl:  .  Omega-3 Fatty Acids (FISH OIL) 500 MG CAPS, Take 1 capsule by mouth daily. , Disp: , Rfl:  .  REBIF 22 MCG/0.5ML SOSY, INJECT 1 PREFILLED SYRINGE (22MCG) SUBCUTANEOUSLY THREE TIMES A WEEK, Disp: 36 Syringe, Rfl: 3  PAST  MEDICAL HISTORY: Past Medical History:  Diagnosis Date  . Benign brain tumor ([place])   . Chronic sinusitis   . Hearing loss   . Hypertension   . Multiple sclerosis ([place])   . Raynaud disease   . Vision abnormalities     PAST SURGICAL HISTORY: Past Surgical History:  Procedure Laterality Date  . BRAIN TUMOR EXCISION    . hystrectomy    . NASAL SINUS SURGERY      FAMILY HISTORY: Family History  Problem Relation Age of Onset  . Healthy Mother   . Lung cancer Father     SOCIAL HISTORY:  Social History   Socioeconomic History  . Marital status: Married    Spouse name: Not on file  . Number of children: Not on file  . Years of education: Not on file  . Highest education level: Not on file  Social Needs  . Financial resource strain: Not on file  . Food insecurity - worry: Not on file  . Food insecurity - inability: Not on file  . Transportation needs - medical: Not on file  . Transportation needs - non-medical: Not on file  Occupational History  . Not on file  Tobacco Use  . Smoking status: Never Smoker  . Smokeless tobacco: Never Used  Substance and Sexual Activity  . Alcohol use: No    Alcohol/week: 0.0 oz  . Drug use: No  . Sexual activity: Not on file  Other Topics Concern  . Not on file  Social History Narrative  . Not on file     PHYSICAL EXAM  Vitals:   09/09/17 0914  BP: 136/78  Pulse: 84  Resp: 14  Weight: 148 lb 8 oz (67.4 kg)  Height: 5\' 6"  (1.676 m)    Body mass index is 23.97 kg/m.   General: The patient is well-developed and well-nourished and in no acute distress  Skin: Extremities are without rash or edema.   Neurologic Exam  Mental status: The patient is alert and oriented x 3 at the time of the examination. The patient has apparent normal recent and remote memory, with an apparently normal attention span and concentration ability.  Speech is normal.  Cranial nerves: Extraocular movements are full.   Patient's strength and  sensation is normal. Trapezius strength is normal.he tongue is midline, and the patient has symmetric elevation of the soft palate. No obvious hearing deficits are noted.  Motor:  Muscle bulk is normal.   Tone is slightly increased in legs. Strength is  5 / 5 in all 4 extremities.   Sensory: Sensory testing shows normal sensation to touch and vibration in the arms or legs.  Coordination: Cerebellar testshows good finger-nose-finger and heel-to-shin  ait and station: Station is normal.   The gait is mildly wide and she has a minimal right foot drop. Tandem gait is wide. Romberg is negative.   Reflexes: Deep tendon reflexes are symmetric and normal in arms and milly increased at knees .                             DIAGNOSTIC DATA (LABS, IMAGING, TESTING) - I reviewed patient records, labs, notes, testing and imaging myself where available.   About 39 pages of notes from [facility] neurology were reviewed including MRI reports and lab reports.       ASSESSMENT AND PLAN  Multiple sclerosis, relapsing-remitting (HCC)  Meningioma (HCC)  Asymmetrical hearing loss of left ear  Other fatigue  Ataxic gait  Memory loss due to medical condition  Dysesthesia   1.   Continue Rebif 22 g 3 times a week.  Check CBC, CMP--- if LFT's still elevated, consider a different DMT.  2.   Stop the HCTZ.   Continue baclofen and increase gabapentin to tid (last one at bedtime) 3.   She has more bruising, also check PT/PTT.   Check TSH as some fatigue.   4.    She will return to see me in 6 months or sooner if she has new or worsening neurologic symptoms.    Richard A. Felecia Shelling, MD, PhD 9/98/3382, 5:05 AM Certified in Neurology, Clinical Neurophysiology, Sleep Medicine, Pain Medicine and Neuroimaging  [facility] [address] [place], [place] [address] [phone]

## 2017-09-10 LAB — CBC WITH DIFFERENTIAL/PLATELET
BASOS ABS: 0 10*3/uL (ref 0.0–0.2)
BASOS: 1 %
EOS (ABSOLUTE): 0.5 10*3/uL — AB (ref 0.0–0.4)
Eos: 9 %
Hematocrit: 38.8 % (ref 34.0–46.6)
Hemoglobin: 13.1 g/dL (ref 11.1–15.9)
IMMATURE GRANS (ABS): 0 10*3/uL (ref 0.0–0.1)
IMMATURE GRANULOCYTES: 0 %
LYMPHS: 29 %
Lymphocytes Absolute: 1.6 10*3/uL (ref 0.7–3.1)
MCH: 30.8 pg (ref 26.6–33.0)
MCHC: 33.8 g/dL (ref 31.5–35.7)
MCV: 91 fL (ref 79–97)
Monocytes Absolute: 0.4 10*3/uL (ref 0.1–0.9)
Monocytes: 8 %
NEUTROS PCT: 53 %
Neutrophils Absolute: 2.9 10*3/uL (ref 1.4–7.0)
PLATELETS: 343 10*3/uL (ref 150–379)
RBC: 4.26 x10E6/uL (ref 3.77–5.28)
RDW: 13.1 % (ref 12.3–15.4)
WBC: 5.4 10*3/uL (ref 3.4–10.8)

## 2017-09-10 LAB — PROTIME-INR
INR: 1 (ref 0.8–1.2)
PROTHROMBIN TIME: 9.9 s (ref 9.1–12.0)

## 2017-09-10 LAB — COMPREHENSIVE METABOLIC PANEL
ALT: 21 IU/L (ref 0–32)
AST: 28 IU/L (ref 0–40)
Albumin/Globulin Ratio: 1.4 (ref 1.2–2.2)
Albumin: 4.3 g/dL (ref 3.6–4.8)
Alkaline Phosphatase: 123 IU/L — ABNORMAL HIGH (ref 39–117)
BUN/Creatinine Ratio: 10 — ABNORMAL LOW (ref 12–28)
BUN: 8 mg/dL (ref 8–27)
Bilirubin Total: 0.3 mg/dL (ref 0.0–1.2)
CALCIUM: 9.8 mg/dL (ref 8.7–10.3)
CO2: 28 mmol/L (ref 20–29)
CREATININE: 0.84 mg/dL (ref 0.57–1.00)
Chloride: 95 mmol/L — ABNORMAL LOW (ref 96–106)
GFR, EST AFRICAN AMERICAN: 84 mL/min/{1.73_m2} (ref >59)
GFR, EST NON AFRICAN AMERICAN: 73 mL/min/{1.73_m2} (ref >59)
GLUCOSE: 81 mg/dL (ref 65–99)
Globulin, Total: 3 g/dL (ref 1.5–4.5)
Potassium: 4.4 mmol/L (ref 3.5–5.2)
Sodium: 136 mmol/L (ref 134–144)
TOTAL PROTEIN: 7.3 g/dL (ref 6.0–8.5)

## 2017-09-10 LAB — APTT: APTT: 29 s (ref 24–33)

## 2017-09-10 LAB — TSH: TSH: 0.799 u[IU]/mL (ref 0.450–4.500)

## 2017-09-12 ENCOUNTER — Telehealth: Payer: Self-pay | Admitting: *Deleted

## 2017-09-12 NOTE — Telephone Encounter (Signed)
-----   Message from Britt Bottom, MD sent at 09/10/2017  4:27 PM EST ----- Please let the patient know that the lab work is fine.

## 2017-09-12 NOTE — Telephone Encounter (Signed)
I have spoken with Dorothy Meyer today and explained that lab work done in our office 09/09/17 looks fine.  She verbalized understanding of same/fim

## 2017-10-03 ENCOUNTER — Telehealth: Payer: Self-pay | Admitting: Neurology

## 2017-10-03 MED ORDER — BACLOFEN 10 MG PO TABS: ORAL_TABLET | ORAL | 0 refills | Status: DC

## 2017-10-03 NOTE — Telephone Encounter (Signed)
Patient requesting a new Rx for baclofen (LIORESAL) 10 MG tablet #20 called to [facility] on [address] in [place]. She normally gets from mail order but does not have enough medication until she gets Rx in the mail.

## 2017-10-03 NOTE — Addendum Note (Signed)
Addended by: France Ravens I on: 10/03/2017 11:26 AM   Modules accepted: Orders

## 2017-10-03 NOTE — Addendum Note (Signed)
Addended by: France Ravens I on: 10/03/2017 11:25 AM   Modules accepted: Orders

## 2017-10-03 NOTE — Telephone Encounter (Signed)
Rx. escribed to [facility] as requested/fim

## 2017-11-10 ENCOUNTER — Other Ambulatory Visit: Payer: Self-pay | Admitting: Neurology

## 2017-11-10 NOTE — Telephone Encounter (Signed)
Printed rx alprazolam faxed to [facility] at [phone]. Received fax confirmation.

## 2017-12-28 ENCOUNTER — Other Ambulatory Visit: Payer: Self-pay | Admitting: Neurology

## 2017-12-28 ENCOUNTER — Telehealth: Payer: Self-pay | Admitting: Neurology

## 2017-12-28 MED ORDER — BACLOFEN 10 MG PO TABS: ORAL_TABLET | ORAL | 3 refills | Status: DC

## 2017-12-28 NOTE — Telephone Encounter (Signed)
Patient requesting refill of baclofen (LIORESAL) 10 MG tablet sent to [facility].

## 2017-12-28 NOTE — Addendum Note (Signed)
Addended by: France Ravens I on: 12/28/2017 10:30 AM   Modules accepted: Orders

## 2017-12-28 NOTE — Telephone Encounter (Signed)
Baclofen escribed to [facility] as requested/fim

## 2018-01-30 ENCOUNTER — Telehealth: Payer: Self-pay | Admitting: *Deleted

## 2018-01-30 MED ORDER — CARISOPRODOL 350 MG PO TABS: 350.0000 mg | ORAL_TABLET | Freq: Three times a day (TID) | ORAL | 5 refills | Status: DC | PRN

## 2018-01-30 NOTE — Telephone Encounter (Signed)
Soma faxed to [facility] in response to faxed request from them/fim

## 2018-03-15 ENCOUNTER — Ambulatory Visit: Payer: Medicare Other | Admitting: Neurology

## 2018-04-11 ENCOUNTER — Ambulatory Visit: Payer: Medicare Other | Admitting: Neurology

## 2018-04-11 ENCOUNTER — Encounter: Payer: Self-pay | Admitting: Neurology

## 2018-04-11 VITALS — BP 138/86 | HR 74 | Resp 16 | Ht 65.0 in | Wt 148.0 lb

## 2018-04-11 DIAGNOSIS — R208 Other disturbances of skin sensation: Secondary | ICD-10-CM | POA: Diagnosis not present

## 2018-04-11 DIAGNOSIS — M542 Cervicalgia: Secondary | ICD-10-CM | POA: Insufficient documentation

## 2018-04-11 DIAGNOSIS — G35 Multiple sclerosis: Secondary | ICD-10-CM

## 2018-04-11 DIAGNOSIS — R260 Ataxic gait: Secondary | ICD-10-CM | POA: Diagnosis not present

## 2018-04-11 DIAGNOSIS — R5383 Other fatigue: Secondary | ICD-10-CM | POA: Diagnosis not present

## 2018-04-11 DIAGNOSIS — H8102 Meniere's disease, left ear: Secondary | ICD-10-CM

## 2018-04-11 MED ORDER — ALPRAZOLAM 0.5 MG PO TABS: ORAL_TABLET | ORAL | 5 refills | Status: DC

## 2018-04-11 MED ORDER — GABAPENTIN 600 MG PO TABS: 600.0000 mg | ORAL_TABLET | Freq: Three times a day (TID) | ORAL | 3 refills | Status: DC

## 2018-04-11 NOTE — Progress Notes (Signed)
[facility]  PATIENT: Dorothy Meyer DOB: 10-29-1950  REFERRING DOCTOR OR PCP:  Scarlette Slice  SOURCE: Patient  _________________________________   HISTORICAL  CHIEF COMPLAINT:  Chief Complaint  Patient presents with  . Multiple Sclerosis    Sts. she continues to tolerate Rebif well.  Sts. she is doing well with Gabapentin BID and occ. TID./fim    HISTORY OF PRESENT ILLNESS:  Dorothy Meyer is a 67 y.o. woman with MS.      Update 04/11/2018: She feels that her MS has been stable.  She has not had any exacerbations.  She is on Rebif 22 mcg 3 times a week.  She tolerates it well for the most part though she does note some bruising.  She had community acquired pneumonia and was hospitalized a few days for IV Abx about 6 weeks ago.   She also has allergies and sinusitis (has had multiple operations).    She felt very poor after a pneumonia vaccine.   She will be getting allergy tests soon and may get allergy shots based on results.     She feels her gait is stable with mild right foot drop.   No falls but some stumbles..  She does not note any focal weakness though she will tire out easily.  She denies numbness in the arms.  She does have a dysesthetic sensation in the trunk. Heat and soma help when that occurs.    MRI did not show T spine plaque.  Bladder function is good.  Vision is good.  She reports difficulties with fatigue.  This has been worse with all her medical issues.   She also has insomnia and at the last visit the nighttime gabapentin dose was increased.  She notes more pain and cracking with movements in her neck.    She has not had any more episodes of severe vertigo that was diagnosed as Mnire's disease in the past.  Update 09/09/2017: Her MS has been stable. She tolerates Rebif 22 mcg well but wonders if it contributes to easy bruising. She denies any exacerbation he tolerates Rebif well. She had blood work 02/15/2017 showing slightly  elevated liver function tests.  She notes choking some while chewing but not swallowing.    Gait is fine.  Strength is good but she tires out easily.   She tries to go to the gym some.  Bladder is doing well.     She gets an icepick sensation around her upper truncal muscles .   T-spine MRI was benign with just mild DJD at T4 and T6-T12 but no foraminal narrowing or disc herniations.    She had more stress over the families with more fatigue and worse sleep.   She often wakes up at 1 am.   If she wakes up, she often takes a Xanax.    If she sleeps poorly she is less functional the next day.    Trazodone was poorly tolerated.    At 5 pm, she takes gabapentin and baclofen but no med's at night.     A couple years ago, she had a lot of vertigo related to high salt intake and HCTZ was started.  She now uses less salt and we discussed stopping it.        From 03/04/2017: MS:   She is on Rebif 22 mcg and tolerates it well.   She had an exacerbation earlier this year involving her legs.   Before the current episode, she had  not had any recent exacerbations. She reports fatigue, mild gait disturbance and cognitive difficulties are stable  Gait:  Gait improved after the steroid IV dose and she is walking 2 miles a day.   The right leg is mildly weaker.  She no longer needs a cane.   She fatigues easily, especially in heat.   She has spasticity and takes baclofen and gabapentin.   Sometimes she will also take soma.  She gets some spasticity, helped by soma and baclofen.   Medicare no longer covers peripheral muscle relaxants and Soma is expensive.    Vision:  Vision is stable.  Her eyes were recently evaluated by ophtho.    In the past, she has had right optic neuritis in 1996.   No diplopia.  Vertigo/Meniere's:  She is having more trouble with her hearing and vertigo.    She is seeing (Dr. Vinetta Bergamo).  For a while, the vertigo / possible  Meniere's is doing very well on HCTZ 25 mg with no recent severe  exacerbation.  However, her hearing loss seems worse.     She presented with severe vertigo (non-positional) in early 2016, that worsened 43/2016.    She had N/V that was severe and balance was so off that she could barely walk during an attack.     Bladder/bowel:   She has noted mild urinary hesitancy. The urinary frequency has resolved. Constipation is no longer a problem with change in activity and diet.   Fatigue/sleep;   She notes fatigue most afternoons but does better in the mornings. She does worse with heat. She notes physical more than cognitive fatigue. Marland Kitchen Provigil and Ritalin had not helped in the past. She sometimes naps and feels better afterwards.    Mood dysfunction:    mood is currently doing well. She denies depression and anxiety is minimal..  She takes Xanax at night anxiety and sleep.. She remains on Wellbutrin and tolerates it well.    Cognitive dysfunction:  She has mild difficulty with focus, attention, verbal fluency and multitasking. These symptoms are stable.  If she has to do more cognitively one day, she feels cognitively wiped out the next day.       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  She sees at [facility] for this. Her last MRI 10/2015 was stable.   She has had gamma knofe.    MS History:    She was diagnosed with multiple sclerosis in the 1997 after she presented with numbness in both arms. An MRI of the cervical spine showed a plaque at C2C3.   An MRI of the brain at that time showed other lesions, compatible with the diagnosis of multiple sclerosis. She had only 3 plaques in the brain initially and treatment was deferred. However, one year later she had optic neuritis, confirmed the diagnosis of multiple sclerosis. Initially, she was placed on Avonex. She then tried Copaxonelater on as she was having flulike symptoms with Avonex. However, she had severe skin reactions with Copaxone that was  stopped. She then went on Rebif. She has been on Rebif for the past 8 years or so and tolerates it very well. Her MS has been mostly stable while on the Rebif. Last exacerbation was 2015.   Her Rebif dose is 22 g. She tolerates that dose well. Her last MRI of the brain was in 2015 and did not show any new lesions.  She  also has a left tentorial-based meningioma that has been stable in size times many years. On the cervical spine MRI she has 3 hyperintense foci consistent with demyelinating plaque.    REVIEW OF SYSTEMS: Constitutional: No fevers, chills, sweats, or change in appetite.   She has fatigue and insomnia.   Eyes: No visual changes, double vision, eye pain Ear, nose and throat: No hearing loss, ear pain, nasal congestion, sore throat Cardiovascular: No chest pain, palpitations Respiratory: No shortness of breath at rest or with exertion.   No wheezes GastrointestinaI: No nausea, vomiting, diarrhea, abdominal pain, fecal incontinence Genitourinary: No dysuria, urinary retention or frequency.  No nocturia. Musculoskeletal: No neck pain, back pain Integumentary: No rash, pruritus, skin lesions Neurological: as above Psychiatric: No current depression at this time.  No anxiety Endocrine: No palpitations, diaphoresis, change in appetite, change in weigh or increased thirst Hematologic/Lymphatic: No anemia, purpura, petechiae. Allergic/Immunologic: No itchy/runny eyes, nasal congestion, recent allergic reactions, rashes  ALLERGIES: Allergies  Allergen Reactions  . Doxycycline Other (See Comments) and Rash    headaches headaches   . Hydrocodone-Acetaminophen Nausea And Vomiting  . Oxycodone Nausea And Vomiting  . Propoxyphene Nausea And Vomiting  . Tramadol Nausea And Vomiting    HOME MEDICATIONS:  Current Outpatient Medications:  .  albuterol (PROVENTIL HFA;VENTOLIN HFA) 108 (90 Base) MCG/ACT inhaler, Inhale into the lungs every 6 (six) hours as needed for wheezing or  shortness of breath., Disp: , Rfl:  .  ALPRAZolam (XANAX) 0.5 MG tablet, TAKE 1 TABLET BY MOUTH AT BEDTIME AS NEEDED FOR ANXIETY, Disp: 30 tablet, Rfl: 5 .  baclofen (LIORESAL) 10 MG tablet, TAKE 1 TABLET BY MOUTH 4  TIMES DAILY, Disp: 360 tablet, Rfl: 3 .  budesonide (PULMICORT) 1 MG/2ML nebulizer solution, Take 1 mg by nebulization daily as needed (shortness of breath). , Disp: , Rfl: 10 .  buPROPion (WELLBUTRIN XL) 150 MG 24 hr tablet, TAKE 1 TABLET BY MOUTH  DAILY, Disp: 90 tablet, Rfl: 3 .  carisoprodol (SOMA) 350 MG tablet, Take 1 tablet (350 mg total) by mouth 3 (three) times daily as needed for muscle spasms., Disp: 90 tablet, Rfl: 5 .  gabapentin (NEURONTIN) 600 MG tablet, Take 1 tablet (600 mg total) by mouth 3 (three) times daily., Disp: 270 tablet, Rfl: 3 .  hydrochlorothiazide (HYDRODIURIL) 25 MG tablet, TAKE 1 TABLET BY MOUTH  DAILY, Disp: 90 tablet, Rfl: 3 .  lisinopril (PRINIVIL,ZESTRIL) 10 MG tablet, TAKE 1 TABLET (10 MG TOTAL) BY MOUTH DAILY., Disp: , Rfl:  .  metroNIDAZOLE (METROGEL) 1 % gel, Apply 1 application topically daily. , Disp: , Rfl:  .  Omega-3 Fatty Acids (FISH OIL) 500 MG CAPS, Take 1 capsule by mouth daily. , Disp: , Rfl:  .  REBIF 22 MCG/0.5ML SOSY, INJECT 1 PREFILLED SYRINGE (22MCG) SUBCUTANEOUSLY THREE TIMES A WEEK, Disp: 36 Syringe, Rfl: 3 .  QVAR REDIHALER 80 MCG/ACT inhaler, , Disp: , Rfl:   PAST MEDICAL HISTORY: Past Medical History:  Diagnosis Date  . Benign brain tumor ([place])   . Chronic sinusitis   . Hearing loss   . Hypertension   . Multiple sclerosis ([place])   . Raynaud disease   . Vision abnormalities     PAST SURGICAL HISTORY: Past Surgical History:  Procedure Laterality Date  . BRAIN TUMOR EXCISION    . hystrectomy    . NASAL SINUS SURGERY      FAMILY HISTORY: Family History  Problem Relation Age of Onset  . Healthy Mother   .  Lung cancer Father     SOCIAL HISTORY:  Social History   Socioeconomic History  . Marital status:  Married    Spouse name: Not on file  . Number of children: Not on file  . Years of education: Not on file  . Highest education level: Not on file  Occupational History  . Not on file  Social Needs  . Financial resource strain: Not on file  . Food insecurity:    Worry: Not on file    Inability: Not on file  . Transportation needs:    Medical: Not on file    Non-medical: Not on file  Tobacco Use  . Smoking status: Never Smoker  . Smokeless tobacco: Never Used  Substance and Sexual Activity  . Alcohol use: No    Alcohol/week: 0.0 standard drinks  . Drug use: No  . Sexual activity: Not on file  Lifestyle  . Physical activity:    Days per week: Not on file    Minutes per session: Not on file  . Stress: Not on file  Relationships  . Social connections:    Talks on phone: Not on file    Gets together: Not on file    Attends religious service: Not on file    Active member of club or organization: Not on file    Attends meetings of clubs or organizations: Not on file    Relationship status: Not on file  . Intimate partner violence:    Fear of current or ex partner: Not on file    Emotionally abused: Not on file    Physically abused: Not on file    Forced sexual activity: Not on file  Other Topics Concern  . Not on file  Social History Narrative  . Not on file     PHYSICAL EXAM  Vitals:   04/11/18 1057  BP: 138/86  Pulse: 74  Resp: 16  Weight: 148 lb (67.1 kg)  Height: 5\' 5"  (1.651 m)    Body mass index is 24.63 kg/m.   General: The patient is well-developed and well-nourished and in no acute distress  Skin/Ext: Extremities are without rash or edema.   Neurologic Exam  Mental status: The patient is alert and oriented x 3 at the time of the examination. The patient has apparent normal recent and remote memory, with an apparently normal attention span and concentration ability.   Speech is normal.  Cranial nerves: Extraocular movements are full.  Facial  strength and sensation is normal. Trapezius strength is normal.he tongue is midline, and the patient has symmetric elevation of the soft palate. No obvious hearing deficits are noted.  Motor:  Muscle bulk is normal.   Muscle tone is mildly increased in the legs.  Strength was normal in the arms and legs. Sensory: Sensory testing shows and vibration sensation is normal in the limbs.  Coordination: Cerebellar testshows good finger-nose-finger and heel-to-shin  ait and station: Station is normal.   The gait is mildly wide..  There is a slight right foot drop.  Tandem gait is wide.  Romberg is negative  Reflexes: Deep tendon reflexes are symmetric and normal in arms and milly increased at knees .                             DIAGNOSTIC DATA (LABS, IMAGING, TESTING) - I reviewed patient records, labs, notes, testing and imaging myself where available.   About 65 pages of notes  from [facility] neurology were reviewed including MRI reports and lab reports.       ASSESSMENT AND PLAN  Multiple sclerosis, relapsing-remitting (HCC)  Ataxic gait  Other fatigue  Dysesthesia  Meniere's disease of left ear  Neck pain   1.   Continue Rebif 22 g 3 times a week.  She gets labwork with her medical doctors last week 2.   Continue baclofen and gabapentin 3 times daily for spasticity and dysesthesias and insomnia 3.   She will return to see me in 6 months or sooner if she has new or worsening neurologic symptoms.    Carnel Stegman A. Felecia Shelling, MD, PhD 1/61/0960, 45:40 AM Certified in Neurology, Clinical Neurophysiology, Sleep Medicine, Pain Medicine and Neuroimaging  [facility] [address], [place], [place] [address] [phone]

## 2018-04-20 ENCOUNTER — Other Ambulatory Visit: Payer: Self-pay | Admitting: Neurology

## 2018-04-26 ENCOUNTER — Telehealth: Payer: Self-pay | Admitting: Neurology

## 2018-04-26 NOTE — Telephone Encounter (Signed)
Pt called requesting a call back to discuss renewing her handicap sticker. Stating she would like this as soon as possible.

## 2018-04-26 NOTE — Telephone Encounter (Signed)
LMOM that handicap placard application will be available to be picked up in our office this afternoon.  Form awaiting RAS sig/fim

## 2018-04-26 NOTE — Telephone Encounter (Signed)
Handicap parking placard up front [facility]/fim

## 2018-06-14 ENCOUNTER — Telehealth: Payer: Self-pay | Admitting: Neurology

## 2018-06-14 NOTE — Telephone Encounter (Signed)
Pt has called and is requesting RN Faith calls her to discuss the outcome of her visiting a Lung specialist and what they are wanting to do.  Pt wants to discuss how this will impact her MS

## 2018-06-14 NOTE — Telephone Encounter (Signed)
Spoke with Curt Bears. She sts. she [facility] (phone# [phone]) and has been dx. with specific ab deficiency, and Dr. Michela Pitcher would like to rx. Hyquvia. She would like to discuss with Dr. Felecia Shelling, if this will overlap with MS tx.  Appt. given/fim

## 2018-06-27 ENCOUNTER — Ambulatory Visit: Payer: Medicare Other | Admitting: Neurology

## 2018-06-27 ENCOUNTER — Other Ambulatory Visit: Payer: Self-pay

## 2018-06-27 ENCOUNTER — Encounter: Payer: Self-pay | Admitting: Neurology

## 2018-06-27 VITALS — BP 129/87 | HR 87 | Wt 149.5 lb

## 2018-06-27 DIAGNOSIS — R7689 Other specified abnormal immunological findings in serum: Secondary | ICD-10-CM

## 2018-06-27 DIAGNOSIS — R5383 Other fatigue: Secondary | ICD-10-CM | POA: Diagnosis not present

## 2018-06-27 DIAGNOSIS — G35A Relapsing-remitting multiple sclerosis: Secondary | ICD-10-CM

## 2018-06-27 DIAGNOSIS — G35 Multiple sclerosis: Secondary | ICD-10-CM | POA: Diagnosis not present

## 2018-06-27 DIAGNOSIS — R208 Other disturbances of skin sensation: Secondary | ICD-10-CM

## 2018-06-27 DIAGNOSIS — D329 Benign neoplasm of meninges, unspecified: Secondary | ICD-10-CM | POA: Diagnosis not present

## 2018-06-27 DIAGNOSIS — R768 Other specified abnormal immunological findings in serum: Secondary | ICD-10-CM | POA: Insufficient documentation

## 2018-06-27 NOTE — Progress Notes (Signed)
[facility]  PATIENT: Dorothy Meyer DOB: 1950/10/26  REFERRING DOCTOR OR PCP:  Scarlette Slice  SOURCE: Patient  _________________________________   HISTORICAL  CHIEF COMPLAINT:  Chief Complaint  Patient presents with  . New Patient (Initial Visit)    RM 13, alone. Last seen 04/11/18. Here to f/u on MS and tx options. She is currently on Rebif. She was recently prescribed Hyquvia by Dr. Michela Pitcher ([facility]) and wants to make sure this will not affect medication she is on for MS.  Can view Dr. Ivor Costa last [place] note on 05/12/18 in EPIC.    HISTORY OF PRESENT ILLNESS:  Dorothy Meyer is a 67 y.o. woman with MS.      Update 06/27/2018: She had a pneumonia June 2019 and was referred to Dr. Michela Pitcher (Pulmonology).   She had antibody levels against pneumococcus before and after a pneumonia vaccination.   Labs were reviewed.  She did not appear to have any significant response to the vaccination.  He did allergy testing and lab tests,  She was prescribed Hyquvia and referred to an immunologist (who she will be seen tomorrow.  Today, she mostly wanted to discuss the implications of the finding and proposed treatment in relation to her multiple sclerosis disease and treatment.  Rebif has not been found to affect influenza vaccination though I do not know if it has any effect on pneumonia vaccination.  Her white blood cell count and her total IgG are normal.  Update 04/11/2018: She feels that her MS has been stable.  She has not had any exacerbations.  She is on Rebif 22 mcg 3 times a week.  She tolerates it well for the most part though she does note some bruising.  She had community acquired pneumonia and was hospitalized a few days for IV Abx about 6 weeks ago.   She also has allergies and sinusitis (has had multiple operations).    She felt very poor after a pneumonia vaccine.   She will be getting allergy tests soon and may get allergy shots based on  results.     She feels her gait is stable with mild right foot drop.   No falls but some stumbles..  She does not note any focal weakness though she will tire out easily.  She denies numbness in the arms.  She does have a dysesthetic sensation in the trunk. Heat and soma help when that occurs.    MRI did not show T spine plaque.  Bladder function is good.  Vision is good.  She reports difficulties with fatigue.  This has been worse with all her medical issues.   She also has insomnia and at the last visit the nighttime gabapentin dose was increased.  She notes more pain and cracking with movements in her neck.    She has not had any more episodes of severe vertigo that was diagnosed as Mnire's disease in the past.  Update 09/09/2017: Her MS has been stable. She tolerates Rebif 22 mcg well but wonders if it contributes to easy bruising. She denies any exacerbation he tolerates Rebif well. She had blood work 02/15/2017 showing slightly elevated liver function tests.  She notes choking some while chewing but not swallowing.    Gait is fine.  Strength is good but she tires out easily.   She tries to go to the gym some.  Bladder is doing well.     She gets an icepick sensation around her upper  truncal muscles .   T-spine MRI was benign with just mild DJD at T4 and T6-T12 but no foraminal narrowing or disc herniations.    She had more stress over the families with more fatigue and worse sleep.   She often wakes up at 1 am.   If she wakes up, she often takes a Xanax.    If she sleeps poorly she is less functional the next day.    Trazodone was poorly tolerated.    At 5 pm, she takes gabapentin and baclofen but no med's at night.     A couple years ago, she had a lot of vertigo related to high salt intake and HCTZ was started.  She now uses less salt and we discussed stopping it.        From 03/04/2017: MS:   She is on Rebif 22 mcg and tolerates it well.   She had an exacerbation earlier this year  involving her legs.   Before the current episode, she had not had any recent exacerbations. She reports fatigue, mild gait disturbance and cognitive difficulties are stable  Gait:  Gait improved after the steroid IV dose and she is walking 2 miles a day.   The right leg is mildly weaker.  She no longer needs a cane.   She fatigues easily, especially in heat.   She has spasticity and takes baclofen and gabapentin.   Sometimes she will also take soma.  She gets some spasticity, helped by soma and baclofen.   Medicare no longer covers peripheral muscle relaxants and Soma is expensive.    Vision:  Vision is stable.  Her eyes were recently evaluated by ophtho.    In the past, she has had right optic neuritis in 1996.   No diplopia.  Vertigo/Meniere's:  She is having more trouble with her hearing and vertigo.    She is seeing (Dr. Vinetta Bergamo).  For a while, the vertigo / possible  Meniere's is doing very well on HCTZ 25 mg with no recent severe exacerbation.  However, her hearing loss seems worse.     She presented with severe vertigo (non-positional) in early 2016, that worsened 43/2016.    She had N/V that was severe and balance was so off that she could barely walk during an attack.     Bladder/bowel:   She has noted mild urinary hesitancy. The urinary frequency has resolved. Constipation is no longer a problem with change in activity and diet.   Fatigue/sleep;   She notes fatigue most afternoons but does better in the mornings. She does worse with heat. She notes physical more than cognitive fatigue. Marland Kitchen Provigil and Ritalin had not helped in the past. She sometimes naps and feels better afterwards.    Mood dysfunction:    mood is currently doing well. She denies depression and anxiety is minimal..  She takes Xanax at night anxiety and sleep.. She remains on Wellbutrin and tolerates it well.    Cognitive dysfunction:  She has mild difficulty with focus, attention, verbal fluency and multitasking. These symptoms  are stable.  If she has to do more cognitively one day, she feels cognitively wiped out the next day.       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  She sees at [facility] for this. Her last MRI 10/2015 was stable.   She has had gamma knofe.    MS History:  She was diagnosed with multiple sclerosis in the 1997 after she presented with numbness in both arms. An MRI of the cervical spine showed a plaque at C2C3.   An MRI of the brain at that time showed other lesions, compatible with the diagnosis of multiple sclerosis. She had only 3 plaques in the brain initially and treatment was deferred. However, one year later she had optic neuritis, confirmed the diagnosis of multiple sclerosis. Initially, she was placed on Avonex. She then tried Copaxonelater on as she was having flulike symptoms with Avonex. However, she had severe skin reactions with Copaxone that was stopped. She then went on Rebif. She has been on Rebif for the past 8 years or so and tolerates it very well. Her MS has been mostly stable while on the Rebif. Last exacerbation was 2015.   Her Rebif dose is 22 g. She tolerates that dose well. Her last MRI of the brain was in 2015 and did not show any new lesions.  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 On the cervical spine MRI she has 3 hyperintense foci consistent with demyelinating plaque.    REVIEW OF SYSTEMS: Constitutional: No fevers, chills, sweats, or change in appetite.   She has fatigue and insomnia.   Eyes: No visual changes, double vision, eye pain Ear, nose and throat: No hearing loss, ear pain, nasal congestion, sore throat Cardiovascular: No chest pain, palpitations Respiratory: No shortness of breath at rest or with exertion.   No wheezes GastrointestinaI: No nausea, vomiting, diarrhea, abdominal pain, fecal incontinence Genitourinary: No  dysuria, urinary retention or frequency.  No nocturia. Musculoskeletal: No neck pain, back pain Integumentary: No rash, pruritus, skin lesions Neurological: as above Psychiatric: No current depression at this time.  No anxiety Endocrine: No palpitations, diaphoresis, change in appetite, change in weigh or increased thirst Hematologic/Lymphatic: No anemia, purpura, petechiae. Allergic/Immunologic: No itchy/runny eyes, nasal congestion, recent allergic reactions, rashes  ALLERGIES: Allergies  Allergen Reactions  . Doxycycline Other (See Comments) and Rash    headaches headaches   . Hydrocodone-Acetaminophen Nausea And Vomiting  . Oxycodone Nausea And Vomiting  . Propoxyphene Nausea And Vomiting  . Tramadol Nausea And Vomiting    HOME MEDICATIONS:  Current Outpatient Medications:  .  albuterol (PROVENTIL HFA;VENTOLIN HFA) 108 (90 Base) MCG/ACT inhaler, Inhale into the lungs every 6 (six) hours as needed for wheezing or shortness of breath., Disp: , Rfl:  .  ALPRAZolam (XANAX) 0.5 MG tablet, TAKE 1 TABLET BY MOUTH AT BEDTIME AS NEEDED FOR ANXIETY, Disp: 30 tablet, Rfl: 5 .  baclofen (LIORESAL) 10 MG tablet, TAKE 1 TABLET BY MOUTH 4  TIMES DAILY, Disp: 360 tablet, Rfl: 3 .  budesonide (PULMICORT) 1 MG/2ML nebulizer solution, Take 1 mg by nebulization daily as needed (shortness of breath). , Disp: , Rfl: 10 .  buPROPion (WELLBUTRIN XL) 150 MG 24 hr tablet, TAKE 1 TABLET BY MOUTH  DAILY, Disp: 90 tablet, Rfl: 3 .  carisoprodol (SOMA) 350 MG tablet, Take 1 tablet (350 mg total) by mouth 3 (three) times daily as needed for muscle spasms., Disp: 90 tablet, Rfl: 5 .  gabapentin (NEURONTIN) 600 MG tablet, Take 1 tablet (600 mg total) by mouth 3 (three) times daily., Disp: 270 tablet, Rfl: 3 .  hydrochlorothiazide (HYDRODIURIL) 25 MG tablet, TAKE 1 TABLET BY MOUTH  DAILY, Disp: 90 tablet, Rfl: 3 .  lisinopril (PRINIVIL,ZESTRIL) 10 MG tablet, TAKE 1 TABLET (10 MG TOTAL) BY MOUTH  DAILY., Disp: ,  Rfl:  .  metroNIDAZOLE (METROGEL) 1 % gel, Apply 1 application topically daily. , Disp: , Rfl:  .  Omega-3 Fatty Acids (FISH OIL) 500 MG CAPS, Take 1 capsule by mouth daily. , Disp: , Rfl:  .  QVAR REDIHALER 80 MCG/ACT inhaler, , Disp: , Rfl:  .  REBIF 22 MCG/0.5ML SOSY, INJECT 1 PREFILLED SYRINGE (22MCG) SUBCUTANEOUSLY THREE TIMES A WEEK, Disp: 36 Syringe, Rfl: 3  PAST MEDICAL HISTORY: Past Medical History:  Diagnosis Date  . Benign brain tumor ([place])   . Chronic sinusitis   . Hearing loss   . Hypertension   . Multiple sclerosis ([place])   . Raynaud disease   . Vision abnormalities     PAST SURGICAL HISTORY: Past Surgical History:  Procedure Laterality Date  . BRAIN TUMOR EXCISION    . hystrectomy    . NASAL SINUS SURGERY      FAMILY HISTORY: Family History  Problem Relation Age of Onset  . Healthy Mother   . Lung cancer Father     SOCIAL HISTORY:  Social History   Socioeconomic History  . Marital status: Married    Spouse name: Not on file  . Number of children: Not on file  . Years of education: Not on file  . Highest education level: Not on file  Occupational History  . Not on file  Social Needs  . Financial resource strain: Not on file  . Food insecurity:    Worry: Not on file    Inability: Not on file  . Transportation needs:    Medical: Not on file    Non-medical: Not on file  Tobacco Use  . Smoking status: Never Smoker  . Smokeless tobacco: Never Used  Substance and Sexual Activity  . Alcohol use: No    Alcohol/week: 0.0 standard drinks  . Drug use: No  . Sexual activity: Not on file  Lifestyle  . Physical activity:    Days per week: Not on file    Minutes per session: Not on file  . Stress: Not on file  Relationships  . Social connections:    Talks on phone: Not on file    Gets together: Not on file    Attends religious service: Not on file    Active member of club or organization: Not on file    Attends meetings of clubs or organizations:  Not on file    Relationship status: Not on file  . Intimate partner violence:    Fear of current or ex partner: Not on file    Emotionally abused: Not on file    Physically abused: Not on file    Forced sexual activity: Not on file  Other Topics Concern  . Not on file  Social History Narrative  . Not on file     PHYSICAL EXAM  Vitals:   06/27/18 0846  BP: 129/87  Pulse: 87  Weight: 149 lb 8 oz (67.8 kg)    Body mass index is 24.88 kg/m.   General: The patient is well-developed and well-nourished and in no acute distress  Skin/Ext: Extremities are without rash or edema.   Neurologic Exam  Mental status: The patient is alert and oriented x 3 at the time of the examination. The patient has apparent normal recent and remote memory, with an apparently normal attention span and concentration ability.   Speech is normal.  Cranial nerves: Extraocular movements are full.  Facial strength is normal.   Hearing appears  to be normal.  Motor: She appears to have normal muscle strength     Gait and station: Station is normal.   The gait is mildly wide.Marland Kitchen                             DIAGNOSTIC DATA (LABS, IMAGING, TESTING) - I reviewed patient records, labs, notes, testing and imaging myself where available.   About 35 pages of notes from [facility] neurology were reviewed including MRI reports and lab reports.       ASSESSMENT AND PLAN  Multiple sclerosis, relapsing-remitting (HCC)  Meningioma (HCC)  Other fatigue  Dysesthesia  Weak antibody response to pneumococcal vaccine   1.   Continue Rebif 22 g 3 times a week for now.  We did discuss a different disease modifying therapy, Aubagio, and she will consider this.   2.   Continue baclofen and gabapentin 3 times daily for spasticity and dysesthesias and insomnia 3.   I do not know if Rebif is playing a role in her poor response to the pneumonia vaccination.  Interferons were studied with the flu vaccine and greater  than 90% had an adequate response.  I could not find a reference for the Pneumovax and interferon beta.  4.   She will be seeing Immunology tomorrow.  She will ask them if they have any trouble with her going on Aubagio instead of Rebif.   5.    There should be no neurologic contraindication to her going on Ig therapy if that is felt to be necessary due to her vaccination response.   6.     She will return to see me in 6 months or sooner if she has new or worsening neurologic symptoms.   30 minutes face-to-face interaction with greater than one half the time counseling and coordinating care about her immunologic issues  Jersie Beel A. Felecia Shelling, MD, PhD 67/59/1638, 46:65 AM Certified in Neurology, Clinical Neurophysiology, Sleep Medicine, Pain Medicine and Neuroimaging  [facility] [address], [place], [place] [address] [phone]

## 2018-07-04 ENCOUNTER — Telehealth: Payer: Self-pay | Admitting: Neurology

## 2018-07-04 NOTE — Telephone Encounter (Signed)
Dr. Sater- FYI 

## 2018-07-04 NOTE — Telephone Encounter (Signed)
FYI-Patient calling to advise she will get her blood drawn in our office on 10-12-18 when she has appointment with Dr. Felecia Shelling.

## 2018-08-21 ENCOUNTER — Telehealth: Payer: Self-pay | Admitting: *Deleted

## 2018-08-21 NOTE — Telephone Encounter (Signed)
Called and spoke with pt. Advised briovarx tried reaching her to schedule shipment for Rebif. She will call them back at [phone]. Nothing further needed.

## 2018-10-12 ENCOUNTER — Encounter: Payer: Self-pay | Admitting: Neurology

## 2018-10-12 ENCOUNTER — Other Ambulatory Visit: Payer: Self-pay

## 2018-10-12 ENCOUNTER — Ambulatory Visit: Payer: Medicare Other | Admitting: Neurology

## 2018-10-12 VITALS — BP 138/88 | HR 82 | Resp 16 | Ht 65.0 in | Wt 147.0 lb

## 2018-10-12 DIAGNOSIS — G35 Multiple sclerosis: Secondary | ICD-10-CM

## 2018-10-12 DIAGNOSIS — R413 Other amnesia: Secondary | ICD-10-CM

## 2018-10-12 DIAGNOSIS — D329 Benign neoplasm of meninges, unspecified: Secondary | ICD-10-CM | POA: Diagnosis not present

## 2018-10-12 DIAGNOSIS — R260 Ataxic gait: Secondary | ICD-10-CM

## 2018-10-12 DIAGNOSIS — R768 Other specified abnormal immunological findings in serum: Secondary | ICD-10-CM

## 2018-10-12 DIAGNOSIS — R5383 Other fatigue: Secondary | ICD-10-CM | POA: Diagnosis not present

## 2018-10-12 MED ORDER — AMANTADINE HCL 100 MG PO CAPS
100.0000 mg | ORAL_CAPSULE | Freq: Two times a day (BID) | ORAL | 5 refills | Status: DC
Start: 2018-10-12 — End: 2019-06-07

## 2018-10-12 NOTE — Progress Notes (Signed)
[facility]  PATIENT: Dorothy Meyer DOB: 01-09-1951  REFERRING DOCTOR OR PCP:  Scarlette Slice  SOURCE: Patient  _________________________________   HISTORICAL  CHIEF COMPLAINT:  Chief Complaint  Patient presents with  . Multiple Sclerosis    Rm. 12.  Sts. she continues to tolerate Rebif with some inj. site fatigue, but does not want to switch dmt's due to fear of side effects. Sts. feels more fatigued and has more stiffness in her legs since last ov/fim    HISTORY OF PRESENT ILLNESS:  Dorothy Meyer is a 68 y.o. woman with MS.      Update 10/12/2018: Although she has not had any new exacerbations, she has more fatigue and muscle cramps.    Her gait is more stiff than usual.   She is getting an icepick dysesthesia in her back and around the waist.   Heat and Soma have helped.   Bladder is doing well.    She has a lot of fatigue.   In the past, Provigil had not helped. Ritalin did not help.    Fatigue is worse.  She is spending more time in bed.   She has some stress with her son's issues.   She may be a little depressed but she does not think it is severe  She had a lot of allergies.   A subcutaneous IVIg was recommended since she had pneumonia and did not make antibodies to her vaccination.    She is also trying to do the [employer].     Update 06/27/2018: She had a pneumonia June 2019 and was referred to Dr. Michela Pitcher (Pulmonology).   She had antibody levels against pneumococcus before and after a pneumonia vaccination.   Labs were reviewed.  She did not appear to have any significant response to the vaccination.  He did allergy testing and lab tests,  She was prescribed Hyquvia and referred to an immunologist (who she will be seen tomorrow.  Today, she mostly wanted to discuss the implications of the finding and proposed treatment in relation to her multiple sclerosis disease and treatment.  Rebif has not been found to affect influenza vaccination though I do  not know if it has any effect on pneumonia vaccination.  Her white blood cell count and her total IgG are normal.  Update 04/11/2018: She feels that her MS has been stable.  She has not had any exacerbations.  She is on Rebif 22 mcg 3 times a week.  She tolerates it well for the most part though she does note some bruising.  She had community acquired pneumonia and was hospitalized a few days for IV Abx about 6 weeks ago.   She also has allergies and sinusitis (has had multiple operations).    She felt very poor after a pneumonia vaccine.   She will be getting allergy tests soon and may get allergy shots based on results.     She feels her gait is stable with mild right foot drop.   No falls but some stumbles..  She does not note any focal weakness though she will tire out easily.  She denies numbness in the arms.  She does have a dysesthetic sensation in the trunk. Heat and soma help when that occurs.    MRI did not show T spine plaque.  Bladder function is good.  Vision is good.  She reports difficulties with fatigue.  This has been worse with all her medical issues.   She also has  insomnia and at the last visit the nighttime gabapentin dose was increased.  She notes more pain and cracking with movements in her neck.    She has not had any more episodes of severe vertigo that was diagnosed as Mnire's disease in the past.  Update 09/09/2017: Her MS has been stable. She tolerates Rebif 22 mcg well but wonders if it contributes to easy bruising. She denies any exacerbation he tolerates Rebif well. She had blood work 02/15/2017 showing slightly elevated liver function tests.  She notes choking some while chewing but not swallowing.    Gait is fine.  Strength is good but she tires out easily.   She tries to go to the gym some.  Bladder is doing well.     She gets an icepick sensation around her upper truncal muscles .   T-spine MRI was benign with just mild DJD at T4 and T6-T12 but no foraminal  narrowing or disc herniations.    She had more stress over the families with more fatigue and worse sleep.   She often wakes up at 1 am.   If she wakes up, she often takes a Xanax.    If she sleeps poorly she is less functional the next day.    Trazodone was poorly tolerated.    At 5 pm, she takes gabapentin and baclofen but no med's at night.     A couple years ago, she had a lot of vertigo related to high salt intake and HCTZ was started.  She now uses less salt and we discussed stopping it.        From 03/04/2017: MS:   She is on Rebif 22 mcg and tolerates it well.   She had an exacerbation earlier this year involving her legs.   Before the current episode, she had not had any recent exacerbations. She reports fatigue, mild gait disturbance and cognitive difficulties are stable  Gait:  Gait improved after the steroid IV dose and she is walking 2 miles a day.   The right leg is mildly weaker.  She no longer needs a cane.   She fatigues easily, especially in heat.   She has spasticity and takes baclofen and gabapentin.   Sometimes she will also take soma.  She gets some spasticity, helped by soma and baclofen.   Medicare no longer covers peripheral muscle relaxants and Soma is expensive.    Vision:  Vision is stable.  Her eyes were recently evaluated by ophtho.    In the past, she has had right optic neuritis in 1996.   No diplopia.  Vertigo/Meniere's:  She is having more trouble with her hearing and vertigo.    She is seeing (Dr. Vinetta Bergamo).  For a while, the vertigo / possible  Meniere's is doing very well on HCTZ 25 mg with no recent severe exacerbation.  However, her hearing loss seems worse.     She presented with severe vertigo (non-positional) in early 2016, that worsened 43/2016.    She had N/V that was severe and balance was so off that she could barely walk during an attack.     Bladder/bowel:   She has noted mild urinary hesitancy. The urinary frequency has resolved. Constipation is no longer a  problem with change in activity and diet.   Fatigue/sleep;   She notes fatigue most afternoons but does better in the mornings. She does worse with heat. She notes physical more than cognitive fatigue. Marland Kitchen Provigil and Ritalin had not helped  in the past. She sometimes naps and feels better afterwards.    Mood dysfunction:    mood is currently doing well. She denies depression and anxiety is minimal..  She takes Xanax at night anxiety and sleep.. She remains on Wellbutrin and tolerates it well.    Cognitive dysfunction:  She has mild difficulty with focus, attention, verbal fluency and multitasking. These symptoms are stable.  If she has to do more cognitively one day, she feels cognitively wiped out the next day.       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  She sees at [facility] for this. Her last MRI 10/2015 was stable.   She has had gamma knofe.    MS History:    She was diagnosed with multiple sclerosis in the 1997 after she presented with numbness in both arms. An MRI of the cervical spine showed a plaque at C2C3.   An MRI of the brain at that time showed other lesions, compatible with the diagnosis of multiple sclerosis. She had only 3 plaques in the brain initially and treatment was deferred. However, one year later she had optic neuritis, confirmed the diagnosis of multiple sclerosis. Initially, she was placed on Avonex. She then tried Copaxonelater on as she was having flulike symptoms with Avonex. However, she had severe skin reactions with Copaxone that was stopped. She then went on Rebif. She has been on Rebif for the past 8 years or so and tolerates it very well. Her MS has been mostly stable while on the Rebif. Last exacerbation was 2015.   Her Rebif dose is 22 g. She tolerates that dose well. Her last MRI of the brain was in 2015 and did not show any new lesions.  She also has a left tentorial-based meningioma that  has been stable in size times many years. On the cervical spine MRI she has 3 hyperintense foci consistent with demyelinating plaque.    REVIEW OF SYSTEMS: Constitutional: No fevers, chills, sweats, or change in appetite.   She has fatigue and insomnia.   Eyes: No visual changes, double vision, eye pain Ear, nose and throat: No hearing loss, ear pain, nasal congestion, sore throat Cardiovascular: No chest pain, palpitations Respiratory: No shortness of breath at rest or with exertion.   No wheezes GastrointestinaI: No nausea, vomiting, diarrhea, abdominal pain, fecal incontinence Genitourinary: No dysuria, urinary retention or frequency.  No nocturia. Musculoskeletal: No neck pain, back pain Integumentary: No rash, pruritus, skin lesions Neurological: as above Psychiatric: No current depression at this time.  No anxiety Endocrine: No palpitations, diaphoresis, change in appetite, change in weigh or increased thirst Hematologic/Lymphatic: No anemia, purpura, petechiae. Allergic/Immunologic: No itchy/runny eyes, nasal congestion, recent allergic reactions, rashes  ALLERGIES: Allergies  Allergen Reactions  . Doxycycline Other (See Comments) and Rash    headaches headaches   . Hydrocodone-Acetaminophen Nausea And Vomiting  . Oxycodone Nausea And Vomiting  . Propoxyphene Nausea And Vomiting  . Tramadol Nausea And Vomiting    HOME MEDICATIONS:  Current Outpatient Medications:  .  albuterol (PROVENTIL HFA;VENTOLIN HFA) 108 (90 Base) MCG/ACT inhaler, Inhale into the lungs every 6 (six) hours as needed for wheezing or shortness of breath., Disp: , Rfl:  .  ALPRAZolam (XANAX) 0.5 MG tablet, TAKE 1 TABLET BY MOUTH AT BEDTIME AS NEEDED FOR ANXIETY, Disp: 30 tablet, Rfl: 5 .  baclofen (LIORESAL) 10 MG tablet, TAKE 1 TABLET BY MOUTH 4  TIMES DAILY, Disp: 360 tablet, Rfl: 3 .  buPROPion (WELLBUTRIN XL) 150 MG 24 hr tablet, TAKE 1 TABLET BY MOUTH  DAILY, Disp: 90 tablet, Rfl: 3 .   carisoprodol (SOMA) 350 MG tablet, Take 1 tablet (350 mg total) by mouth 3 (three) times daily as needed for muscle spasms., Disp: 90 tablet, Rfl: 5 .  gabapentin (NEURONTIN) 600 MG tablet, Take 1 tablet (600 mg total) by mouth 3 (three) times daily., Disp: 270 tablet, Rfl: 3 .  lisinopril (PRINIVIL,ZESTRIL) 10 MG tablet, TAKE 1 TABLET (10 MG TOTAL) BY MOUTH DAILY., Disp: , Rfl:  .  metroNIDAZOLE (METROGEL) 1 % gel, Apply 1 application topically daily. , Disp: , Rfl:  .  Omega-3 Fatty Acids (FISH OIL) 500 MG CAPS, Take 1 capsule by mouth daily. , Disp: , Rfl:  .  QVAR REDIHALER 80 MCG/ACT inhaler, , Disp: , Rfl:  .  REBIF 22 MCG/0.5ML SOSY, INJECT 1 PREFILLED SYRINGE (22MCG) SUBCUTANEOUSLY THREE TIMES A WEEK, Disp: 36 Syringe, Rfl: 3 .  amantadine (SYMMETREL) 100 MG capsule, Take 1 capsule (100 mg total) by mouth 2 (two) times daily., Disp: 60 capsule, Rfl: 5  PAST MEDICAL HISTORY: Past Medical History:  Diagnosis Date  . Benign brain tumor ([place])   . Chronic sinusitis   . Hearing loss   . Hypertension   . Multiple sclerosis ([place])   . Raynaud disease   . Vision abnormalities     PAST SURGICAL HISTORY: Past Surgical History:  Procedure Laterality Date  . BRAIN TUMOR EXCISION    . hystrectomy    . NASAL SINUS SURGERY      FAMILY HISTORY: Family History  Problem Relation Age of Onset  . Healthy Mother   . Lung cancer Father     SOCIAL HISTORY:  Social History   Socioeconomic History  . Marital status: Married    Spouse name: Not on file  . Number of children: Not on file  . Years of education: Not on file  . Highest education level: Not on file  Occupational History  . Not on file  Social Needs  . Financial resource strain: Not on file  . Food insecurity:    Worry: Not on file    Inability: Not on file  . Transportation needs:    Medical: Not on file    Non-medical: Not on file  Tobacco Use  . Smoking status: Never Smoker  . Smokeless tobacco: Never Used   Substance and Sexual Activity  . Alcohol use: No    Alcohol/week: 0.0 standard drinks  . Drug use: No  . Sexual activity: Not on file  Lifestyle  . Physical activity:    Days per week: Not on file    Minutes per session: Not on file  . Stress: Not on file  Relationships  . Social connections:    Talks on phone: Not on file    Gets together: Not on file    Attends religious service: Not on file    Active member of club or organization: Not on file    Attends meetings of clubs or organizations: Not on file    Relationship status: Not on file  . Intimate partner violence:    Fear of current or ex partner: Not on file    Emotionally abused: Not on file    Physically abused: Not on file    Forced sexual activity: Not on file  Other Topics Concern  . Not on file  Social History Narrative  .  Not on file     PHYSICAL EXAM  Vitals:   10/12/18 1059  BP: 138/88  Pulse: 82  Resp: 16  Weight: 147 lb (66.7 kg)  Height: 5\' 5"  (1.651 m)    Body mass index is 24.46 kg/m.   General: The patient is well-developed and well-nourished and in no acute distress  Skin/Ext: Extremities are without rash or edema.   Neurologic Exam  Mental status: The patient is alert and oriented x 3 at the time of the examination. The patient has apparent normal recent and remote memory, with an apparently normal attention span and concentration ability.   Speech is normal.  Cranial nerves: Extraocular movements are full.  Facial strength and sensation was normal.  Trapezius strength was normal..   Hearing appears to be normal.  Motor: Muscle bulk, tone and strength was normal.  Sensory: She had intact sensation to touch and vibration in the arms and legs.  Gait and station: Station is normal.   The gait is mildly wide and tandem gait is moderately wide  Deep tendon reflexes: DTRs were 2 and symmetric in the arms, 3 at the knees and 2 at the ankles.                             DIAGNOSTIC  DATA (LABS, IMAGING, TESTING) - I reviewed patient records, labs, notes, testing and imaging myself where available.   About 25 pages of notes from [facility] neurology were reviewed including MRI reports and lab reports.       ASSESSMENT AND PLAN  Multiple sclerosis, relapsing-remitting ([place]) - Plan: Amb ref to Medical Nutrition Therapy-MNT  Meningioma ([place])  Ataxic gait  Other fatigue - Plan: Amb ref to Medical Nutrition Therapy-MNT  Memory loss due to medical condition  Weak antibody response to pneumococcal vaccine   1.   Continue Rebif 22 g 3 times a week for now.  We have discussed other options but she would prefer to stay on Rebif as she has tolerated it well. 2.   Continue baclofen and gabapentin for spasticity dysesthesias and insomnia 3.   ambulatory referral to Nutrition.   Referral to LCSW/Counselor for stress management / MS  4    She will return to see me in 6 months or sooner if she has new or worsening neurologic symptoms.  40 minute face-to-face office visit with greater than one half the time counseling and coordinating care about diet, conditioning and other lifestyle issues that may help her MS and related symptoms.     A. Felecia Shelling, MD, PhD 0/17/5102, 58:52 AM Certified in Neurology, Clinical Neurophysiology, Sleep Medicine, Pain Medicine and Neuroimaging  [facility] [address], [place], [place] [address] [phone]

## 2018-10-25 ENCOUNTER — Other Ambulatory Visit: Payer: Self-pay | Admitting: Neurology

## 2018-11-09 ENCOUNTER — Other Ambulatory Visit: Payer: Self-pay | Admitting: Neurology

## 2018-11-13 ENCOUNTER — Other Ambulatory Visit: Payer: Self-pay | Admitting: Neurology

## 2018-11-14 ENCOUNTER — Other Ambulatory Visit: Payer: Self-pay | Admitting: *Deleted

## 2018-11-14 MED ORDER — CARISOPRODOL 350 MG PO TABS: ORAL_TABLET | ORAL | 5 refills | Status: DC

## 2018-11-20 ENCOUNTER — Other Ambulatory Visit: Payer: Self-pay | Admitting: Neurology

## 2018-11-25 ENCOUNTER — Other Ambulatory Visit: Payer: Self-pay | Admitting: Neurology

## 2019-03-19 ENCOUNTER — Other Ambulatory Visit: Payer: Self-pay | Admitting: Neurology

## 2019-04-12 ENCOUNTER — Ambulatory Visit: Payer: Medicare Other | Admitting: Neurology

## 2019-04-19 ENCOUNTER — Other Ambulatory Visit: Payer: Self-pay | Admitting: *Deleted

## 2019-04-19 MED ORDER — ALPRAZOLAM 0.5 MG PO TABS: ORAL_TABLET | ORAL | 5 refills | Status: DC

## 2019-04-28 ENCOUNTER — Other Ambulatory Visit: Payer: Self-pay | Admitting: Neurology

## 2019-06-07 ENCOUNTER — Other Ambulatory Visit: Payer: Self-pay

## 2019-06-07 ENCOUNTER — Ambulatory Visit: Payer: Medicare Other | Admitting: Neurology

## 2019-06-07 ENCOUNTER — Encounter: Payer: Self-pay | Admitting: Neurology

## 2019-06-07 VITALS — BP 133/88 | HR 78 | Temp 97.6°F | Wt 150.0 lb

## 2019-06-07 DIAGNOSIS — R260 Ataxic gait: Secondary | ICD-10-CM | POA: Diagnosis not present

## 2019-06-07 DIAGNOSIS — G35A Relapsing-remitting multiple sclerosis: Secondary | ICD-10-CM

## 2019-06-07 DIAGNOSIS — R3911 Hesitancy of micturition: Secondary | ICD-10-CM

## 2019-06-07 DIAGNOSIS — G35D Multiple sclerosis, unspecified: Secondary | ICD-10-CM

## 2019-06-07 DIAGNOSIS — R413 Other amnesia: Secondary | ICD-10-CM | POA: Diagnosis not present

## 2019-06-07 DIAGNOSIS — Z79899 Other long term (current) drug therapy: Secondary | ICD-10-CM | POA: Diagnosis not present

## 2019-06-07 DIAGNOSIS — G35 Multiple sclerosis: Secondary | ICD-10-CM | POA: Diagnosis not present

## 2019-06-07 DIAGNOSIS — F418 Other specified anxiety disorders: Secondary | ICD-10-CM

## 2019-06-07 NOTE — Progress Notes (Signed)
[facility]  PATIENT: Dorothy Meyer DOB: 05-Dec-1950  REFERRING DOCTOR OR PCP:  Scarlette Slice  SOURCE: Patient  _________________________________   HISTORICAL  CHIEF COMPLAINT:  Chief Complaint  Patient presents with  . Follow-up    MS follow up pt alone room 13    HISTORY OF PRESENT ILLNESS:  Kamyria Meyer is a 68 y.o. woman with MS.    She was diagnosed with MS in 1996.    Update 06/07/2019: She feels her MS is stable with no exacerbations.  Her last definite exacerbation was last year and we gave streroids.   Her gait is about the same with some stiffness but no falls.   She feels the right leg is a little weaker than her left (old).    She gets a sharp icepick sensation in her legs unrelated to neck position.     Bladder function is good.  Vision is fine and unchanged.      Fatigue fluctuates a lot.   Provigil and Ritalin had not helped.   She sleeps well about half the night.     She has more sneezing and sinus related symptoms and that affects sleep.  Mood is fine.   She feels note mild cognitive fog, some days more than others.   Update 10/12/2018: Although she has not had any new exacerbations, she has more fatigue and muscle cramps.    Her gait is more stiff than usual.   She is getting an icepick dysesthesia in her back and around the waist.   Heat and Soma have helped.   Bladder is doing well.    She has a lot of fatigue.   In the past, Provigil had not helped. Ritalin did not help.    Fatigue is worse.  She is spending more time in bed.   She has some stress with her son's issues.   She may be a little depressed but she does not think it is severe  She had a lot of allergies.   A subcutaneous IVIg was recommended since she had pneumonia and did not make antibodies to her vaccination.    She is also trying to do the [employer].     Update 06/27/2018: She had a pneumonia June 2019 and was referred to Dr. Michela Pitcher (Pulmonology).   She had antibody  levels against pneumococcus before and after a pneumonia vaccination.   Labs were reviewed.  She did not appear to have any significant response to the vaccination.  He did allergy testing and lab tests,  She was prescribed Hyquvia and referred to an immunologist (who she will be seen tomorrow.  Today, she mostly wanted to discuss the implications of the finding and proposed treatment in relation to her multiple sclerosis disease and treatment.  Rebif has not been found to affect influenza vaccination though I do not know if it has any effect on pneumonia vaccination.  Her white blood cell count and her total IgG are normal.  Update 04/11/2018: She feels that her MS has been stable.  She has not had any exacerbations.  She is on Rebif 22 mcg 3 times a week.  She tolerates it well for the most part though she does note some bruising.  She had community acquired pneumonia and was hospitalized a few days for IV Abx about 6 weeks ago.   She also has allergies and sinusitis (has had multiple operations).    She felt very poor after a pneumonia vaccine.  She will be getting allergy tests soon and may get allergy shots based on results.     She feels her gait is stable with mild right foot drop.   No falls but some stumbles..  She does not note any focal weakness though she will tire out easily.  She denies numbness in the arms.  She does have a dysesthetic sensation in the trunk. Heat and soma help when that occurs.    MRI did not show T spine plaque.  Bladder function is good.  Vision is good.  She reports difficulties with fatigue.  This has been worse with all her medical issues.   She also has insomnia and at the last visit the nighttime gabapentin dose was increased.  She notes more pain and cracking with movements in her neck.    She has not had any more episodes of severe vertigo that was diagnosed as Mnire's disease in the past.  Update 09/09/2017: Her MS has been stable. She tolerates Rebif 22  mcg well but wonders if it contributes to easy bruising. She denies any exacerbation he tolerates Rebif well. She had blood work 02/15/2017 showing slightly elevated liver function tests.  She notes choking some while chewing but not swallowing.    Gait is fine.  Strength is good but she tires out easily.   She tries to go to the gym some.  Bladder is doing well.     She gets an icepick sensation around her upper truncal muscles .   T-spine MRI was benign with just mild DJD at T4 and T6-T12 but no foraminal narrowing or disc herniations.    She had more stress over the families with more fatigue and worse sleep.   She often wakes up at 1 am.   If she wakes up, she often takes a Xanax.    If she sleeps poorly she is less functional the next day.    Trazodone was poorly tolerated.    At 5 pm, she takes gabapentin and baclofen but no med's at night.     A couple years ago, she had a lot of vertigo related to high salt intake and HCTZ was started.  She now uses less salt and we discussed stopping it.        From 03/04/2017: MS:   She is on Rebif 22 mcg and tolerates it well.   She had an exacerbation earlier this year involving her legs.   Before the current episode, she had not had any recent exacerbations. She reports fatigue, mild gait disturbance and cognitive difficulties are stable  Gait:  Gait improved after the steroid IV dose and she is walking 2 miles a day.   The right leg is mildly weaker.  She no longer needs a cane.   She fatigues easily, especially in heat.   She has spasticity and takes baclofen and gabapentin.   Sometimes she will also take soma.  She gets some spasticity, helped by soma and baclofen.   Medicare no longer covers peripheral muscle relaxants and Soma is expensive.    Vision:  Vision is stable.  Her eyes were recently evaluated by ophtho.    In the past, she has had right optic neuritis in 1996.   No diplopia.  Vertigo/Meniere's:  She is having more trouble with her hearing  and vertigo.    She is seeing (Dr. Vinetta Bergamo).  For a while, the vertigo / possible  Meniere's is doing very well on HCTZ 25 mg  with no recent severe exacerbation.  However, her hearing loss seems worse.     She presented with severe vertigo (non-positional) in early 2016, that worsened 43/2016.    She had N/V that was severe and balance was so off that she could barely walk during an attack.     Bladder/bowel:   She has noted mild urinary hesitancy. The urinary frequency has resolved. Constipation is no longer a problem with change in activity and diet.   Fatigue/sleep;   She notes fatigue most afternoons but does better in the mornings. She does worse with heat. She notes physical more than cognitive fatigue. Marland Kitchen Provigil and Ritalin had not helped in the past. She sometimes naps and feels better afterwards.    Mood dysfunction:    mood is currently doing well. She denies depression and anxiety is minimal..  She takes Xanax at night anxiety and sleep.. She remains on Wellbutrin and tolerates it well.    Cognitive dysfunction:  She has mild difficulty with focus, attention, verbal fluency and multitasking. These symptoms are stable.  If she has to do more cognitively one day, she feels cognitively wiped out the next day.       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  She sees at [facility] for this. Her last MRI 10/2015 was stable.   She has had gamma knofe.    MS History:    She was diagnosed with multiple sclerosis in the 1997 after she presented with numbness in both arms. An MRI of the cervical spine showed a plaque at C2C3.   An MRI of the brain at that time showed other lesions, compatible with the diagnosis of multiple sclerosis. She had only 3 plaques in the brain initially and treatment was deferred. However, one year later she had optic neuritis, confirmed the diagnosis of multiple sclerosis. Initially, she was placed on  Avonex. She then tried Copaxonelater on as she was having flulike symptoms with Avonex. However, she had severe skin reactions with Copaxone that was stopped. She then went on Rebif. She has been on Rebif for the past 8 years or so and tolerates it very well. Her MS has been mostly stable while on the Rebif. Last exacerbation was 2015.   Her Rebif dose is 22 g. She tolerates that dose well. Her last MRI of the brain was in 2015 and did not show any new lesions.  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 On the cervical spine MRI she has 3 hyperintense foci consistent with demyelinating plaque.    REVIEW OF SYSTEMS: Constitutional: No fevers, chills, sweats, or change in appetite.   She has fatigue and insomnia.   Eyes: No visual changes, double vision, eye pain Ear, nose and throat: No hearing loss, ear pain, nasal congestion, sore throat Cardiovascular: No chest pain, palpitations Respiratory: No shortness of breath at rest or with exertion.   No wheezes GastrointestinaI: No nausea, vomiting, diarrhea, abdominal pain, fecal incontinence Genitourinary: No dysuria, urinary retention or frequency.  No nocturia. Musculoskeletal: No neck pain, back pain Integumentary: No rash, pruritus, skin lesions Neurological: as above Psychiatric: No current depression at this time.  No anxiety Endocrine: No palpitations, diaphoresis, change in appetite, change in weigh or increased thirst Hematologic/Lymphatic: No anemia, purpura, petechiae. Allergic/Immunologic: No itchy/runny eyes, nasal congestion, recent allergic reactions, rashes  ALLERGIES: Allergies  Allergen Reactions  .  Doxycycline Other (See Comments) and Rash    headaches headaches   . Hydrocodone-Acetaminophen Nausea And Vomiting  . Oxycodone Nausea And Vomiting  . Propoxyphene Nausea And Vomiting  . Tramadol Nausea And Vomiting    HOME MEDICATIONS:  Current Outpatient  Medications:  .  albuterol (PROVENTIL HFA;VENTOLIN HFA) 108 (90 Base) MCG/ACT inhaler, Inhale into the lungs every 6 (six) hours as needed for wheezing or shortness of breath., Disp: , Rfl:  .  ALPRAZolam (XANAX) 0.5 MG tablet, Take 1 tablet by mouth at bedtime as needed for anxiety, Disp: 30 tablet, Rfl: 5 .  baclofen (LIORESAL) 10 MG tablet, TAKE 1 TABLET BY MOUTH 4  TIMES DAILY, Disp: 360 tablet, Rfl: 3 .  buPROPion (WELLBUTRIN XL) 150 MG 24 hr tablet, TAKE 1 TABLET BY MOUTH  DAILY, Disp: 90 tablet, Rfl: 3 .  carisoprodol (SOMA) 350 MG tablet, TAKE 1 TABLET BY MOUTH THREE TIMES A DAY AS NEEDED FOR MUSCLE SPASMS, Disp: 90 tablet, Rfl: 5 .  diltiazem (CARDIZEM CD) 120 MG 24 hr capsule, Take by mouth., Disp: , Rfl:  .  gabapentin (NEURONTIN) 600 MG tablet, Take 1 tablet (600 mg total) by mouth 3 (three) times daily., Disp: 270 tablet, Rfl: 3 .  lisinopril (PRINIVIL,ZESTRIL) 10 MG tablet, TAKE 1 TABLET (10 MG TOTAL) BY MOUTH DAILY., Disp: , Rfl:  .  metroNIDAZOLE (METROGEL) 1 % gel, Apply 1 application topically daily. , Disp: , Rfl:  .  Omega-3 Fatty Acids (FISH OIL) 500 MG CAPS, Take 1 capsule by mouth daily. , Disp: , Rfl:  .  QVAR REDIHALER 80 MCG/ACT inhaler, , Disp: , Rfl:  .  REBIF 22 MCG/0.5ML SOSY, INJECT 22MCG SUBCUTANEOUSLY THREE TIMES A WEEK AT LEAST 48 HOURS APART, Disp: 18 mL, Rfl: 3  PAST MEDICAL HISTORY: Past Medical History:  Diagnosis Date  . Benign brain tumor ([place])   . Chronic sinusitis   . Hearing loss   . Hypertension   . Multiple sclerosis ([place])   . Raynaud disease   . Vision abnormalities     PAST SURGICAL HISTORY: Past Surgical History:  Procedure Laterality Date  . BRAIN TUMOR EXCISION    . hystrectomy    . NASAL SINUS SURGERY      FAMILY HISTORY: Family History  Problem Relation Age of Onset  . Healthy Mother   . Lung cancer Father     SOCIAL HISTORY:  Social History   Socioeconomic History  . Marital status: Married    Spouse name: Not on file   . Number of children: Not on file  . Years of education: Not on file  . Highest education level: Not on file  Occupational History  . Not on file  Social Needs  . Financial resource strain: Not on file  . Food insecurity    Worry: Not on file    Inability: Not on file  . Transportation needs    Medical: Not on file    Non-medical: Not on file  Tobacco Use  . Smoking status: Never Smoker  . Smokeless tobacco: Never Used  Substance and Sexual Activity  . Alcohol use: No    Alcohol/week: 0.0 standard drinks  . Drug use: No  . Sexual activity: Not on file  Lifestyle  . Physical activity    Days per week: Not on file    Minutes per session: Not on file  . Stress: Not on file  Relationships  . Social connections    Talks on phone: Not on  file    Gets together: Not on file    Attends religious service: Not on file    Active member of club or organization: Not on file    Attends meetings of clubs or organizations: Not on file    Relationship status: Not on file  . Intimate partner violence    Fear of current or ex partner: Not on file    Emotionally abused: Not on file    Physically abused: Not on file    Forced sexual activity: Not on file  Other Topics Concern  . Not on file  Social History Narrative  . Not on file     PHYSICAL EXAM  Vitals:   06/07/19 0826  BP: 133/88  Pulse: 78  Temp: 97.6 F (36.4 C)  Weight: 150 lb (68 kg)    Body mass index is 24.96 kg/m.   General: The patient is well-developed and well-nourished and in no acute distress  Skin/Ext: Extremities are without rash or edema.   Neurologic Exam  Mental status: The patient is alert and oriented x 3 at the time of the examination. The patient has apparent normal recent and remote memory, with an apparently normal attention span and concentration ability.   Speech is normal.  Cranial nerves: Extraocular movements are full.  Facial strength and sensation was normal.  Trapezius strength was  normal..   Hearing appears to be normal.  Motor: Muscle bulk tone and strength is normal.  Sensory: She had intact sensation to touch and vibration in the arms and legs.  Gait and station: Station is normal.   Her gait is mildly wide and the tandem gait is moderately wide. Deep tendon reflexes: DTRs were 2 and symmetric in the arms, 3 at the knees and 2 at the ankles.                        ASSESSMENT AND PLAN  Multiple sclerosis ([place]) - Plan: QuantiFERON-TB Gold Plus  High risk medication use - Plan: QuantiFERON-TB Gold Plus  Multiple sclerosis, relapsing-remitting (HCC)  Ataxic gait  Memory loss due to medical condition  Urinary hesitancy  Depression with anxiety   1.   She has had issues with injection site reactions from the Rebif.  She is interested in switching to a different medication.  We discussed some options including Aubagio.  We went over the risks and benefits.  She has recent blood work and liver function test was fine.  CBC is fine.  We will check a QuantiFERON-TB test.   2.   Continue baclofen and gabapentin for spasticity dysesthesias and insomnia 3.   Stay active and exercise as tolerated.  She will return to see me in 6 months or sooner if she has new or worsening neurologic symptoms.  Richard A. Felecia Shelling, MD, PhD [id number], [id number] PM Certified in Neurology, Clinical Neurophysiology, Sleep Medicine, Pain Medicine and Neuroimaging  [facility] [address], [place], [place] [address] [phone]

## 2019-06-09 LAB — QUANTIFERON-TB GOLD PLUS
QuantiFERON Mitogen Value: >10 IU/mL
QuantiFERON Nil Value: 0.03 IU/mL
QuantiFERON TB1 Ag Value: 0.03 IU/mL
QuantiFERON TB2 Ag Value: 0.03 IU/mL
QuantiFERON-TB Gold Plus: NEGATIVE

## 2019-06-11 ENCOUNTER — Telehealth: Payer: Self-pay | Admitting: *Deleted

## 2019-06-11 NOTE — Telephone Encounter (Signed)
Called pt to let her know TB test negative and ok to start on Aubagio per Dr. Felecia Shelling. She has decided to hold off on starting until after the first of the year. Her insurance will also be changing. She will call back and let us know once she is ready to switch. She will stay on Rebif at this time.

## 2019-06-15 ENCOUNTER — Other Ambulatory Visit: Payer: Self-pay | Admitting: Neurology

## 2019-08-11 ENCOUNTER — Other Ambulatory Visit: Payer: Self-pay | Admitting: Neurology

## 2019-09-10 ENCOUNTER — Telehealth: Payer: Self-pay

## 2019-09-10 ENCOUNTER — Other Ambulatory Visit: Payer: Self-pay | Admitting: *Deleted

## 2019-09-10 MED ORDER — GABAPENTIN 600 MG PO TABS: 600.0000 mg | ORAL_TABLET | Freq: Three times a day (TID) | ORAL | 3 refills | Status: DC

## 2019-09-10 MED ORDER — BACLOFEN 10 MG PO TABS: ORAL_TABLET | ORAL | 3 refills | Status: DC

## 2019-09-10 NOTE — Telephone Encounter (Signed)
I received refill requests for baclofen and gabapentin this am and already escribed to [facility].

## 2019-09-10 NOTE — Telephone Encounter (Signed)
Patient called to inform that her insurance has changed for her medications this year [employer] and they will be faxing over paperwork to request her prescriptions.  Please follow up.

## 2019-09-28 ENCOUNTER — Telehealth: Payer: Self-pay | Admitting: Neurology

## 2019-09-28 NOTE — Telephone Encounter (Signed)
I returned the call to the patient. Reports she is using Rebif, as prescribed. She is aware that Dr. Felecia Shelling is recommending the COVID-19 vaccination by either [facility] or [place]. She verbalized understanding.

## 2019-09-28 NOTE — Telephone Encounter (Signed)
Pt called wanting to know if it is safe for her to receive the Covid Vaccination. Please advise.

## 2019-10-04 ENCOUNTER — Other Ambulatory Visit: Payer: Self-pay | Admitting: *Deleted

## 2019-10-04 ENCOUNTER — Telehealth: Payer: Self-pay | Admitting: *Deleted

## 2019-10-04 DIAGNOSIS — G35 Multiple sclerosis: Secondary | ICD-10-CM

## 2019-10-04 MED ORDER — REBIF 22 MCG/0.5ML ~~LOC~~ SOSY: 22.0000 ug | PREFILLED_SYRINGE | SUBCUTANEOUS | 3 refills | Status: DC

## 2019-10-04 NOTE — Telephone Encounter (Signed)
She wanted to switch to Aubagio due to injection site reaction from Rebif.  She had wanted to wait until the new year because of changes in insurance anticipated.  There is no rush to switch..  We can see if we can get her preauthorized for Aubagio if she wants to switch sooner or she can stay on Rebif for little longer if she prefers.  If she is having second thoughts about Aubagio I can try to bring her in sometime in the next month.

## 2019-10-04 NOTE — Telephone Encounter (Signed)
Called pt because we received fax from [employer] that they were unable to reach her to get her new insurance info. She states she changed insurance from [facility] to [employer]. She now uses [facility]. I updated her pharmacy list. I faxed back notice to [employer] letting them know about change.  New insurance info for [employer]:  ID: [id number]. RxBin: [id number]. RxPcn: [id number]. RxGrp: [id number]. Phone: [phone].   Pt has about 3 months of Rebif left. She has f/u 12/06/19. She wants to know if she should wait to switch to Aubagio until that appt or if she could be fit in sooner for appt to discuss. She is worried about running out of Rebif if she waits. I went ahead and escribed rx Rebif to [facility] for her. Advised it may require PA. Advised I will ask Dr. Felecia Shelling and call her back.

## 2019-10-04 NOTE — Telephone Encounter (Signed)
Called pt. She will keep appt 12/06/19 and stay on Rebif for now. If PA needed for [facility] we will complete. She will discuss changing meds at her f/u. Nothing further needed.

## 2019-10-24 ENCOUNTER — Telehealth: Payer: Self-pay | Admitting: *Deleted

## 2019-10-24 DIAGNOSIS — G35 Multiple sclerosis: Secondary | ICD-10-CM

## 2019-10-24 NOTE — Telephone Encounter (Signed)
Faxed completed/signed PA Rebif to [employer] at [phone]. Received fax confirmation. Waiting on determination.

## 2019-10-29 ENCOUNTER — Other Ambulatory Visit: Payer: Self-pay | Admitting: *Deleted

## 2019-10-29 MED ORDER — CARISOPRODOL 350 MG PO TABS: ORAL_TABLET | ORAL | 5 refills | Status: DC

## 2019-10-30 MED ORDER — REBIF 22 MCG/0.5ML ~~LOC~~ SOSY: 22.0000 ug | PREFILLED_SYRINGE | SUBCUTANEOUS | 11 refills | Status: DC

## 2019-10-30 NOTE — Addendum Note (Signed)
Addended by: Wyvonnia Lora on: 10/30/2019 10:25 AM   Modules accepted: Orders

## 2019-10-30 NOTE — Telephone Encounter (Signed)
Called [employer] to check on status of PA. Spoke with Vicente Males. PA approved 08/31/2019-08/29/2020. Specialty medications limited to 30 days supply. Rx will need to be sent in for 30 days supply since previous one was for 90 days supply. I escribed this.

## 2019-11-05 ENCOUNTER — Encounter: Payer: Self-pay | Admitting: Neurology

## 2019-11-05 ENCOUNTER — Telehealth: Payer: Self-pay | Admitting: Neurology

## 2019-11-05 ENCOUNTER — Ambulatory Visit: Payer: Medicare PPO | Admitting: Neurology

## 2019-11-05 ENCOUNTER — Other Ambulatory Visit: Payer: Self-pay

## 2019-11-05 VITALS — BP 146/86 | HR 71 | Ht 66.0 in | Wt 153.0 lb

## 2019-11-05 DIAGNOSIS — R5383 Other fatigue: Secondary | ICD-10-CM

## 2019-11-05 DIAGNOSIS — D329 Benign neoplasm of meninges, unspecified: Secondary | ICD-10-CM | POA: Diagnosis not present

## 2019-11-05 DIAGNOSIS — R208 Other disturbances of skin sensation: Secondary | ICD-10-CM | POA: Diagnosis not present

## 2019-11-05 DIAGNOSIS — G35 Multiple sclerosis: Secondary | ICD-10-CM

## 2019-11-05 DIAGNOSIS — R260 Ataxic gait: Secondary | ICD-10-CM

## 2019-11-05 MED ORDER — METHYLPREDNISOLONE 4 MG PO TBPK: ORAL_TABLET | ORAL | 0 refills | Status: DC

## 2019-11-05 MED ORDER — ALPRAZOLAM 0.5 MG PO TABS: ORAL_TABLET | ORAL | 5 refills | Status: DC

## 2019-11-05 MED ORDER — GABAPENTIN 600 MG PO TABS: 600.0000 mg | ORAL_TABLET | Freq: Four times a day (QID) | ORAL | 1 refills | Status: DC | PRN

## 2019-11-05 NOTE — Telephone Encounter (Signed)
Called pt back. She had first covid-19 shot a couple months ago. Received second shot about a month ago. She has had an electric shock sensation in left leg the last couple months. However, this past weekend it started in both legs. She also has numbness in face/neck. She has sharp/stabbing pain in right eye intermittently. Denis blurry/double vision. No UTI sx. Still on rebif, has not missed any doses. Scheduled work in appt with Dr. Felecia Shelling today at 2:30pm. Asked she check in by 2:00pm and bring updated insurance cards with her. She verbalized understanding.

## 2019-11-05 NOTE — Progress Notes (Signed)
[facility]  PATIENT: Dorothy Meyer DOB: 1950/10/23  REFERRING DOCTOR OR PCP:  Scarlette Slice  SOURCE: Patient  _________________________________   HISTORICAL  CHIEF COMPLAINT:  Chief Complaint  Patient presents with  . Follow-up    RM 12, alone. On rebif. electric shock sensation in left leg the last couple months. However, this past weekend it started in both legs. She also has numbness in face/neck. She has sharp/stabbing pain in right eye intermittently.    HISTORY OF PRESENT ILLNESS:  Dorothy Meyer is a 69 y.o. woman with MS.    She was diagnosed with MS in 1996.    Update 11/05/2019: She is on Rebif and has been on since 2007.  Previously she had been on Avonex x 10 years but switched for s.e.   Copaxone was poorly tolerated.   She had the onset of numbness in the right face 3 weeks ago but over the last week has spread to include more of the neck.    She has had right eye pain as well.  She also has had OD visual changed in the past.     Off and on, she has had left leg pain but since last week has had pain across he buttock and down the right leg.    She is taking 3 gabapentin's daily.    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   This was treated with gamma knife.   She has a mild hearing loss as sequela.     She has sinusitis which acts up periodically.    Benadryl helped these symptms and sleep.      Update 06/07/2019: She feels her MS is stable with no exacerbations.  Her last definite exacerbation was last year and we gave streroids.   Her gait is about the same with some stiffness but no falls.   She feels the right leg is a little weaker than her left (old).    She gets a sharp icepick sensation in her legs unrelated to neck position.     Bladder function is good.  Vision is fine and unchanged.      Fatigue fluctuates a lot.   Provigil and Ritalin had not helped.   She sleeps  well about half the night.     She has more sneezing and sinus related symptoms and that affects sleep.  Mood is fine.   She feels note mild cognitive fog, some days more than others.   Update 10/12/2018: Although she has not had any new exacerbations, she has more fatigue and muscle cramps.    Her gait is more stiff than usual.   She is getting an icepick dysesthesia in her back and around the waist.   Heat and Soma have helped.   Bladder is doing well.    She has a lot of fatigue.   In the past, Provigil had not helped. Ritalin did not help.    Fatigue is worse.  She is spending more time in bed.   She has some stress with her son's issues.   She may be a little depressed but she does not think it is severe  She had a lot of allergies.   A subcutaneous IVIg was recommended since she had pneumonia and did not make antibodies to her vaccination.    She is also trying to do the [employer].  Update 06/27/2018: She had a pneumonia June 2019 and was referred to Dr. Michela Pitcher (Pulmonology).   She had antibody levels against pneumococcus before and after a pneumonia vaccination.   Labs were reviewed.  She did not appear to have any significant response to the vaccination.  He did allergy testing and lab tests,  She was prescribed Hyquvia and referred to an immunologist (who she will be seen tomorrow.  Today, she mostly wanted to discuss the implications of the finding and proposed treatment in relation to her multiple sclerosis disease and treatment.  Rebif has not been found to affect influenza vaccination though I do not know if it has any effect on pneumonia vaccination.  Her white blood cell count and her total IgG are normal.  Update 04/11/2018: She feels that her MS has been stable.  She has not had any exacerbations.  She is on Rebif 22 mcg 3 times a week.  She tolerates it well for the most part though she does note some bruising.  She had community acquired pneumonia and was hospitalized a few days  for IV Abx about 6 weeks ago.   She also has allergies and sinusitis (has had multiple operations).    She felt very poor after a pneumonia vaccine.   She will be getting allergy tests soon and may get allergy shots based on results.     She feels her gait is stable with mild right foot drop.   No falls but some stumbles..  She does not note any focal weakness though she will tire out easily.  She denies numbness in the arms.  She does have a dysesthetic sensation in the trunk. Heat and soma help when that occurs.    MRI did not show T spine plaque.  Bladder function is good.  Vision is good.  She reports difficulties with fatigue.  This has been worse with all her medical issues.   She also has insomnia and at the last visit the nighttime gabapentin dose was increased.  She notes more pain and cracking with movements in her neck.    She has not had any more episodes of severe vertigo that was diagnosed as Mnire's disease in the past.  Update 09/09/2017: Her MS has been stable. She tolerates Rebif 22 mcg well but wonders if it contributes to easy bruising. She denies any exacerbation he tolerates Rebif well. She had blood work 02/15/2017 showing slightly elevated liver function tests.  She notes choking some while chewing but not swallowing.    Gait is fine.  Strength is good but she tires out easily.   She tries to go to the gym some.  Bladder is doing well.     She gets an icepick sensation around her upper truncal muscles .   T-spine MRI was benign with just mild DJD at T4 and T6-T12 but no foraminal narrowing or disc herniations.    She had more stress over the families with more fatigue and worse sleep.   She often wakes up at 1 am.   If she wakes up, she often takes a Xanax.    If she sleeps poorly she is less functional the next day.    Trazodone was poorly tolerated.    At 5 pm, she takes gabapentin and baclofen but no med's at night.     A couple years ago, she had a lot of vertigo related  to high salt intake and HCTZ was started.  She now uses less salt  and we discussed stopping it.        From 03/04/2017: MS:   She is on Rebif 22 mcg and tolerates it well.   She had an exacerbation earlier this year involving her legs.   Before the current episode, she had not had any recent exacerbations. She reports fatigue, mild gait disturbance and cognitive difficulties are stable  Gait:  Gait improved after the steroid IV dose and she is walking 2 miles a day.   The right leg is mildly weaker.  She no longer needs a cane.   She fatigues easily, especially in heat.   She has spasticity and takes baclofen and gabapentin.   Sometimes she will also take soma.  She gets some spasticity, helped by soma and baclofen.   Medicare no longer covers peripheral muscle relaxants and Soma is expensive.    Vision:  Vision is stable.  Her eyes were recently evaluated by ophtho.    In the past, she has had right optic neuritis in 1996.   No diplopia.  Vertigo/Meniere's:  She is having more trouble with her hearing and vertigo.    She is seeing (Dr. Vinetta Bergamo).  For a while, the vertigo / possible  Meniere's is doing very well on HCTZ 25 mg with no recent severe exacerbation.  However, her hearing loss seems worse.     She presented with severe vertigo (non-positional) in early 2016, that worsened 43/2016.    She had N/V that was severe and balance was so off that she could barely walk during an attack.     Bladder/bowel:   She has noted mild urinary hesitancy. The urinary frequency has resolved. Constipation is no longer a problem with change in activity and diet.   Fatigue/sleep;   She notes fatigue most afternoons but does better in the mornings. She does worse with heat. She notes physical more than cognitive fatigue. Marland Kitchen Provigil and Ritalin had not helped in the past. She sometimes naps and feels better afterwards.    Mood dysfunction:    mood is currently doing well. She denies depression and anxiety is minimal..   She takes Xanax at night anxiety and sleep.. She remains on Wellbutrin and tolerates it well.    Cognitive dysfunction:  She has mild difficulty with focus, attention, verbal fluency and multitasking. These symptoms are stable.  If she has to do more cognitively one day, she feels cognitively wiped out the next day.       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  She sees at [facility] for this. Her last MRI 10/2015 was stable.   She has had gamma knofe.    MS History:    She was diagnosed with multiple sclerosis in the 1997 after she presented with numbness in both arms. An MRI of the cervical spine showed a plaque at C2C3.   An MRI of the brain at that time showed other lesions, compatible with the diagnosis of multiple sclerosis. She had only 3 plaques in the brain initially and treatment was deferred. However, one year later she had optic neuritis, confirmed the diagnosis of multiple sclerosis. Initially, she was placed on Avonex. She then tried Copaxonelater on as she was having flulike symptoms with Avonex. However, she had severe skin reactions with Copaxone that was stopped. She then went on Rebif. She has been on Rebif for the past 8 years or so and tolerates it very  well. Her MS has been mostly stable while on the Rebif. Last exacerbation was 2015.   Her Rebif dose is 22 g. She tolerates that dose well. Her last MRI of the brain was in 2015 and did not show any new lesions.  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 On the cervical spine MRI she has 3 hyperintense foci consistent with demyelinating plaque.    REVIEW OF SYSTEMS: Constitutional: No fevers, chills, sweats, or change in appetite.   She has fatigue and insomnia.   Eyes: No visual changes, double vision, eye pain Ear, nose and throat: No hearing loss, ear pain, nasal congestion, sore throat Cardiovascular:  No chest pain, palpitations Respiratory: No shortness of breath at rest or with exertion.   No wheezes GastrointestinaI: No nausea, vomiting, diarrhea, abdominal pain, fecal incontinence Genitourinary: No dysuria, urinary retention or frequency.  No nocturia. Musculoskeletal: No neck pain, back pain Integumentary: No rash, pruritus, skin lesions Neurological: as above Psychiatric: No current depression at this time.  No anxiety Endocrine: No palpitations, diaphoresis, change in appetite, change in weigh or increased thirst Hematologic/Lymphatic: No anemia, purpura, petechiae. Allergic/Immunologic: No itchy/runny eyes, nasal congestion, recent allergic reactions, rashes  ALLERGIES: Allergies  Allergen Reactions  . Doxycycline Other (See Comments) and Rash    headaches headaches   . Hydrocodone-Acetaminophen Nausea And Vomiting  . Oxycodone Nausea And Vomiting  . Propoxyphene Nausea And Vomiting  . Tramadol Nausea And Vomiting    HOME MEDICATIONS:  Current Outpatient Medications:  .  albuterol (PROVENTIL HFA;VENTOLIN HFA) 108 (90 Base) MCG/ACT inhaler, Inhale into the lungs every 6 (six) hours as needed for wheezing or shortness of breath., Disp: , Rfl:  .  ALPRAZolam (XANAX) 0.5 MG tablet, Take 1 tablet by mouth at bedtime as needed for anxiety, Disp: 30 tablet, Rfl: 5 .  baclofen (LIORESAL) 10 MG tablet, TAKE 1 TABLET BY MOUTH 4  TIMES DAILY, Disp: 360 tablet, Rfl: 3 .  buPROPion (WELLBUTRIN XL) 150 MG 24 hr tablet, TAKE 1 TABLET BY MOUTH  DAILY, Disp: 90 tablet, Rfl: 3 .  carisoprodol (SOMA) 350 MG tablet, TAKE 1 TABLET BY MOUTH THREE TIMES A DAY AS NEEDED FOR MUSCLE SPASMS, Disp: 90 tablet, Rfl: 5 .  gabapentin (NEURONTIN) 600 MG tablet, Take 1 tablet (600 mg total) by mouth 4 (four) times daily as needed., Disp: 120 tablet, Rfl: 1 .  Interferon Beta-1a (REBIF) 22 MCG/0.5ML SOSY, Inject 22 mcg into the skin 3 (three) times a week. At least 48 hours apart., Disp: 6 mL, Rfl:  11 .  lisinopril-hydrochlorothiazide (ZESTORETIC) 10-12.5 MG tablet, Take 1 tablet by mouth daily., Disp: , Rfl:  .  metroNIDAZOLE (METROGEL) 1 % gel, Apply 1 application topically daily. , Disp: , Rfl:  .  Omega-3 Fatty Acids (FISH OIL) 500 MG CAPS, Take 1 capsule by mouth daily. , Disp: , Rfl:  .  QVAR REDIHALER 80 MCG/ACT inhaler, , Disp: , Rfl:  .  diltiazem (CARDIZEM CD) 120 MG 24 hr capsule, Take by mouth., Disp: , Rfl:  .  methylPREDNISolone (MEDROL DOSEPAK) 4 MG TBPK tablet, Taper from 6 pills to one pill over 6 days., Disp: 21 tablet, Rfl: 0  PAST MEDICAL HISTORY: Past Medical History:  Diagnosis Date  . Benign brain tumor ([place])   . Chronic sinusitis   . Hearing loss   . Hypertension   . Multiple sclerosis ([place])   . Raynaud disease   . Vision abnormalities  PAST SURGICAL HISTORY: Past Surgical History:  Procedure Laterality Date  . BRAIN TUMOR EXCISION    . hystrectomy    . NASAL SINUS SURGERY      FAMILY HISTORY: Family History  Problem Relation Age of Onset  . Healthy Mother   . Lung cancer Father     SOCIAL HISTORY:  Social History   Socioeconomic History  . Marital status: Married    Spouse name: Not on file  . Number of children: Not on file  . Years of education: Not on file  . Highest education level: Not on file  Occupational History  . Not on file  Tobacco Use  . Smoking status: Never Smoker  . Smokeless tobacco: Never Used  Substance and Sexual Activity  . Alcohol use: No    Alcohol/week: 0.0 standard drinks  . Drug use: No  . Sexual activity: Not on file  Other Topics Concern  . Not on file  Social History Narrative  . Not on file   Social Determinants of Health   Financial Resource Strain:   . Difficulty of Paying Living Expenses: Not on file  Food Insecurity:   . Worried About Charity fundraiser in the Last Year: Not on file  . Ran Out of Food in the Last Year: Not on file  Transportation Needs:   . Lack of Transportation  (Medical): Not on file  . Lack of Transportation (Non-Medical): Not on file  Physical Activity:   . Days of Exercise per Week: Not on file  . Minutes of Exercise per Session: Not on file  Stress:   . Feeling of Stress : Not on file  Social Connections:   . Frequency of Communication with Friends and Family: Not on file  . Frequency of Social Gatherings with Friends and Family: Not on file  . Attends Religious Services: Not on file  . Active Member of Clubs or Organizations: Not on file  . Attends Archivist Meetings: Not on file  . Marital Status: Not on file  Intimate Partner Violence:   . Fear of Current or Ex-Partner: Not on file  . Emotionally Abused: Not on file  . Physically Abused: Not on file  . Sexually Abused: Not on file     PHYSICAL EXAM  Vitals:   11/05/19 1416  BP: (!) 146/86  Pulse: 71  Weight: 153 lb (69.4 kg)  Height: 5\' 6"  (1.676 m)    Body mass index is 24.69 kg/m.   General: The patient is well-developed and well-nourished and in no acute distress  Skin/Ext: Extremities are without rash or edema.   Neurologic Exam  Mental status: The patient is alert and oriented x 3 at the time of the examination. The patient has apparent normal recent and remote memory, with an apparently normal attention span and concentration ability.   Speech is normal.  Cranial nerves: Extraocular movements are full.  Facial strength and sensation was normal.  Trapezius strength was normal..   Hearing appears to be normal.  Motor: Muscle bulk, tone and strength are normal.  Sensory: She had intact sensation to touch and vibration in the arms and legs.  Gait and station: Station is normal.   Her gait is mildly wide and the tandem gait is moderately wide.  Deep tendon reflexes: reflexes were 2 and symmetric in the arms, 3 at the knees and 2 at the ankles.  ASSESSMENT AND PLAN  Multiple sclerosis, relapsing-remitting (HCC)  Meningioma  (HCC)  Dysesthesia  Other fatigue  Ataxic gait   1.   Continue Rebif.   We have discussed switching to Aubagio.   2.   Continue baclofen and gabapentin for spasticity dysesthesias and insomnia.   Can increase gabapentin to qid.   Consider amitriptyline if leg dysesthesias worsen or do not improve.    Also check lumbar  3.   Stay active and exercise as tolerated.  She will return to see me in 6 months or sooner if she has new or worsening neurologic symptoms.  Daisia Slomski A. Felecia Shelling, MD, PhD [id number], [id number] PM Certified in Neurology, Clinical Neurophysiology, Sleep Medicine, Pain Medicine and Neuroimaging  [facility] [address] [place], [place] [address] [phone]

## 2019-11-05 NOTE — Telephone Encounter (Signed)
Pt called stating she thinks she is having an exacerbation at the moment and she is needing to speak to the RN. Please advise.

## 2019-11-20 ENCOUNTER — Telehealth: Payer: Self-pay | Admitting: *Deleted

## 2019-11-20 NOTE — Telephone Encounter (Signed)
Received fax from [facility] requesting rx for Rebif II Auto-injector. Sent back signed form to them at [phone]. Qty: 2. Directions: to be used in conjunction with Rebif therapy. Use as directed. Received fax confirmation.

## 2019-11-26 ENCOUNTER — Telehealth: Payer: Self-pay | Admitting: Neurology

## 2019-11-26 MED ORDER — LAMOTRIGINE 100 MG PO TABS: ORAL_TABLET | ORAL | 5 refills | Status: DC

## 2019-11-26 NOTE — Telephone Encounter (Signed)
Pt is asking for a call from RN to discuss side effects to 2nd Covid-19 vaccine

## 2019-11-26 NOTE — Telephone Encounter (Signed)
Since she is still having dysesthetic pain in the leg that is not helped by gabapentin, we can see if she gets a benefit from lamotrigine.  Please call in the 100 mg  For 5 days, just take one half pill a day. For the next 5 days, take one half pill twice a day. For the next 5 days, take one half pill 3 times a day Then start taking one pill twice a day from this point on.    In the future, we may increase the dose further.  If rash, need to stop the medication and not take it again.

## 2019-11-26 NOTE — Telephone Encounter (Signed)
Called pt and relayed Dr. Garth Bigness recommendation. She is agreeable to try this. I escribed to [place], [place]. She will call back if she develops a rash on medication. Nothing further needed.

## 2019-11-26 NOTE — Telephone Encounter (Signed)
Dr. Sater- please advise 

## 2019-11-26 NOTE — Addendum Note (Signed)
Addended byRonnald Ramp, Brynnlee Cumpian L on: 11/26/2019 04:00 PM   Modules accepted: Orders

## 2019-11-26 NOTE — Telephone Encounter (Signed)
Called pt back to get more info. She is still having pain in left leg. She has been taking advil, gabapentin 600mg  po QID. Ineffective. Wanting to know what else Dr. Felecia Shelling recommendations. Wanted to remind Dr. Felecia Shelling that this all started after second covid-19 vaccine. Advised I will speak with MD and call back.

## 2019-12-06 ENCOUNTER — Other Ambulatory Visit: Payer: Self-pay

## 2019-12-06 ENCOUNTER — Encounter: Payer: Self-pay | Admitting: Neurology

## 2019-12-06 ENCOUNTER — Ambulatory Visit: Payer: Medicare PPO | Admitting: Neurology

## 2019-12-06 VITALS — BP 129/82 | HR 90 | Temp 98.0°F | Ht 66.0 in | Wt 154.5 lb

## 2019-12-06 DIAGNOSIS — G35 Multiple sclerosis: Secondary | ICD-10-CM | POA: Diagnosis not present

## 2019-12-06 DIAGNOSIS — F418 Other specified anxiety disorders: Secondary | ICD-10-CM | POA: Diagnosis not present

## 2019-12-06 DIAGNOSIS — R208 Other disturbances of skin sensation: Secondary | ICD-10-CM

## 2019-12-06 DIAGNOSIS — Z79899 Other long term (current) drug therapy: Secondary | ICD-10-CM

## 2019-12-06 DIAGNOSIS — R260 Ataxic gait: Secondary | ICD-10-CM

## 2019-12-06 DIAGNOSIS — K760 Fatty (change of) liver, not elsewhere classified: Secondary | ICD-10-CM | POA: Diagnosis not present

## 2019-12-06 NOTE — Progress Notes (Signed)
[facility]  PATIENT: Dorothy Meyer DOB: [birth year]  REFERRING DOCTOR OR PCP:  Dorothy Meyer  SOURCE: Patient  _________________________________   HISTORICAL  CHIEF COMPLAINT:  Chief Complaint  Patient presents with  . Follow-up    Rm 12. Last seen 11/05/2019  . Multiple Sclerosis    On Rebif.   . Dysesthesia    Takes lamotrigine    HISTORY OF PRESENT ILLNESS:  Dorothy Meyer is a 69 y.o. woman with MS.    She was diagnosed with relapsing remitting MS in 1996.    Update 12/06/2019: She is noting more stiffness in her legs.   We discussed exercising more, going to the gym.   She is noting some proximal weakness and if on floor has trouble getting up without both arms.    Heat seems to be affecting her more.  The right leg is more spastic than the left.   She has a dull left buttock pain.   The combination of gabapentin and lamotrigine.     She is tolerating Rebif well and has no exacerbations.   We did discuss some other options (Aubagio and Mavenclad).       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   This was treated with gamma knife.   She has a mild hearing loss as sequela.     Update 11/05/2019: She is on Rebif and has been on since 2007.  Previously she had been on Avonex x 10 years but switched for s.e.   Copaxone was poorly tolerated.   She had the onset of numbness in the right face 3 weeks ago but over the last week has spread to include more of the neck.    She has had right eye pain as well.  She also has had OD visual changed in the past.     Off and on, she has had left leg pain but since last week has had pain across he buttock and down the right leg.    She is taking 3 gabapentin's daily.    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   This was treated with gamma knife.   She has a mild hearing loss as sequela.     She has sinusitis which acts up periodically.    Benadryl helped  these symptms and sleep.      Update 06/07/2019: She feels her MS is stable with no exacerbations.  Her last definite exacerbation was last year and we gave streroids.   Her gait is about the same with some stiffness but no falls.   She feels the right leg is a little weaker than her left (old).    She gets a sharp icepick sensation in her legs unrelated to neck position.     Bladder function is good.  Vision is fine and unchanged.      Fatigue fluctuates a lot.   Provigil and Ritalin had not helped.   She sleeps well about half the night.     She has more sneezing and sinus related symptoms and that affects sleep.  Mood is fine.   She feels note mild cognitive fog, some days more than others.   Update 10/12/2018: Although she has not had any new exacerbations, she has more fatigue and muscle cramps.    Her gait is more stiff than usual.   She is getting an icepick dysesthesia  in her back and around the waist.   Heat and Soma have helped.   Bladder is doing well.    She has a lot of fatigue.   In the past, Provigil had not helped. Ritalin did not help.    Fatigue is worse.  She is spending more time in bed.   She has some stress with her son's issues.   She may be a little depressed but she does not think it is severe  She had a lot of allergies.   A subcutaneous IVIg was recommended since she had pneumonia and did not make antibodies to her vaccination.    She is also trying to do the [employer].     Update 06/27/2018: She had a pneumonia June 2019 and was referred to Dr. Michela Meyer (Pulmonology).   She had antibody levels against pneumococcus before and after a pneumonia vaccination.   Labs were reviewed.  She did not appear to have any significant response to the vaccination.  He did allergy testing and lab tests,  She was prescribed Hyquvia and referred to an immunologist (who she will be seen tomorrow.  Today, she mostly wanted to discuss the implications of the finding and proposed treatment in  relation to her multiple sclerosis disease and treatment.  Rebif has not been found to affect influenza vaccination though I do not know if it has any effect on pneumonia vaccination.  Her white blood cell count and her total IgG are normal.  Update 04/11/2018: She feels that her MS has been stable.  She has not had any exacerbations.  She is on Rebif 22 mcg 3 times a week.  She tolerates it well for the most part though she does note some bruising.  She had community acquired pneumonia and was hospitalized a few days for IV Abx about 6 weeks ago.   She also has allergies and sinusitis (has had multiple operations).    She felt very poor after a pneumonia vaccine.   She will be getting allergy tests soon and may get allergy shots based on results.     She feels her gait is stable with mild right foot drop.   No falls but some stumbles..  She does not note any focal weakness though she will tire out easily.  She denies numbness in the arms.  She does have a dysesthetic sensation in the trunk. Heat and soma help when that occurs.    MRI did not show T spine plaque.  Bladder function is good.  Vision is good.  She reports difficulties with fatigue.  This has been worse with all her medical issues.   She also has insomnia and at the last visit the nighttime gabapentin dose was increased.  She notes more pain and cracking with movements in her neck.    She has not had any more episodes of severe vertigo that was diagnosed as Mnire's disease in the past.  Update 09/09/2017: Her MS has been stable. She tolerates Rebif 22 mcg well but wonders if it contributes to easy bruising. She denies any exacerbation he tolerates Rebif well. She had blood work 02/15/2017 showing slightly elevated liver function tests.  She notes choking some while chewing but not swallowing.    Gait is fine.  Strength is good but she tires out easily.   She tries to go to the gym some.  Bladder is doing well.     She gets an icepick  sensation around her upper truncal muscles .  T-spine MRI was benign with just mild DJD at T4 and T6-T12 but no foraminal narrowing or disc herniations.    She had more stress over the families with more fatigue and worse sleep.   She often wakes up at 1 am.   If she wakes up, she often takes a Xanax.    If she sleeps poorly she is less functional the next day.    Trazodone was poorly tolerated.    At 5 pm, she takes gabapentin and baclofen but no med's at night.     A couple years ago, she had a lot of vertigo related to high salt intake and HCTZ was started.  She now uses less salt and we discussed stopping it.        From 03/04/2017: MS:   She is on Rebif 22 mcg and tolerates it well.   She had an exacerbation earlier this year involving her legs.   Before the current episode, she had not had any recent exacerbations. She reports fatigue, mild gait disturbance and cognitive difficulties are stable  Gait:  Gait improved after the steroid IV dose and she is walking 2 miles a day.   The right leg is mildly weaker.  She no longer needs a cane.   She fatigues easily, especially in heat.   She has spasticity and takes baclofen and gabapentin.   Sometimes she will also take soma.  She gets some spasticity, helped by soma and baclofen.   Medicare no longer covers peripheral muscle relaxants and Soma is expensive.    Vision:  Vision is stable.  Her eyes were recently evaluated by ophtho.    In the past, she has had right optic neuritis in 1996.   No diplopia.  Vertigo/Meniere's:  She is having more trouble with her hearing and vertigo.    She is seeing (Dr. Vinetta Bergamo).  For a while, the vertigo / possible  Meniere's is doing very well on HCTZ 25 mg with no recent severe exacerbation.  However, her hearing loss seems worse.     She presented with severe vertigo (non-positional) in early 2016, that worsened 43/2016.    She had N/V that was severe and balance was so off that she could barely walk during an attack.      Bladder/bowel:   She has noted mild urinary hesitancy. The urinary frequency has resolved. Constipation is no longer a problem with change in activity and diet.   Fatigue/sleep;   She notes fatigue most afternoons but does better in the mornings. She does worse with heat. She notes physical more than cognitive fatigue. Marland Kitchen Provigil and Ritalin had not helped in the past. She sometimes naps and feels better afterwards.    Mood dysfunction:    mood is currently doing well. She denies depression and anxiety is minimal..  She takes Xanax at night anxiety and sleep.. She remains on Wellbutrin and tolerates it well.    Cognitive dysfunction:  She has mild difficulty with focus, attention, verbal fluency and multitasking. These symptoms are stable.  If she has to do more cognitively one day, she feels cognitively wiped out the next day.       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  She sees at [facility] for this. Her last MRI 10/2015 was stable.   She has had gamma knofe.    MS History:    She was diagnosed with multiple  sclerosis in the 1997 after she presented with numbness in both arms. An MRI of the cervical spine showed a plaque at C2C3.   An MRI of the brain at that time showed other lesions, compatible with the diagnosis of multiple sclerosis. She had only 3 plaques in the brain initially and treatment was deferred. However, one year later she had optic neuritis, confirmed the diagnosis of multiple sclerosis. Initially, she was placed on Avonex. She then tried Copaxonelater on as she was having flulike symptoms with Avonex. However, she had severe skin reactions with Copaxone that was stopped. She then went on Rebif. She has been on Rebif for the past 8 years or so and tolerates it very well. Her MS has been mostly stable while on the Rebif. Last exacerbation was 2015.   Her Rebif dose is 22 g. She tolerates that dose well.  Her last MRI of the brain was in 2015 and did not show any new lesions.  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 On the cervical spine MRI she has 3 hyperintense foci consistent with demyelinating plaque.    REVIEW OF SYSTEMS: Constitutional: No fevers, chills, sweats, or change in appetite.   She has fatigue and insomnia.   Eyes: No visual changes, double vision, eye pain Ear, nose and throat: No hearing loss, ear pain, nasal congestion, sore throat Cardiovascular: No chest pain, palpitations Respiratory: No shortness of breath at rest or with exertion.   No wheezes GastrointestinaI: No nausea, vomiting, diarrhea, abdominal pain, fecal incontinence Genitourinary: No dysuria, urinary retention or frequency.  No nocturia. Musculoskeletal: No neck pain, back pain Integumentary: No rash, pruritus, skin lesions Neurological: as above Psychiatric: No current depression at this time.  No anxiety Endocrine: No palpitations, diaphoresis, change in appetite, change in weigh or increased thirst Hematologic/Lymphatic: No anemia, purpura, petechiae. Allergic/Immunologic: No itchy/runny eyes, nasal congestion, recent allergic reactions, rashes  ALLERGIES: Allergies  Allergen Reactions  . Doxycycline Other (See Comments) and Rash    headaches headaches   . Hydrocodone-Acetaminophen Nausea And Vomiting  . Oxycodone Nausea And Vomiting  . Propoxyphene Nausea And Vomiting  . Tramadol Nausea And Vomiting    HOME MEDICATIONS:  Current Outpatient Medications:  .  albuterol (PROVENTIL HFA;VENTOLIN HFA) 108 (90 Base) MCG/ACT inhaler, Inhale into the lungs every 6 (six) hours as needed for wheezing or shortness of breath., Disp: , Rfl:  .  ALPRAZolam (XANAX) 0.5 MG tablet, Take 1 tablet by mouth at bedtime as needed for anxiety, Disp: 30 tablet, Rfl: 5 .  baclofen (LIORESAL) 10 MG tablet, TAKE 1 TABLET BY MOUTH 4  TIMES DAILY, Disp: 360 tablet,  Rfl: 3 .  buPROPion (WELLBUTRIN XL) 150 MG 24 hr tablet, TAKE 1 TABLET BY MOUTH  DAILY, Disp: 90 tablet, Rfl: 3 .  carisoprodol (SOMA) 350 MG tablet, TAKE 1 TABLET BY MOUTH THREE TIMES A DAY AS NEEDED FOR MUSCLE SPASMS, Disp: 90 tablet, Rfl: 5 .  gabapentin (NEURONTIN) 600 MG tablet, Take 1 tablet (600 mg total) by mouth 4 (four) times daily as needed., Disp: 120 tablet, Rfl: 1 .  Interferon Beta-1a (REBIF) 22 MCG/0.5ML SOSY, Inject 22 mcg into the skin 3 (three) times a week. At least 48 hours apart., Disp: 6 mL, Rfl: 11 .  lamoTRIgine (LAMICTAL) 100 MG tablet, For the first 5 days, take half a tablet once daily. For the next 5 days, take half a tablet twice daily.For the next 5 days, take  half a tablet 3 times daily. Then start taking one tablet twice a day thereafter., Disp: 60 tablet, Rfl: 5 .  lisinopril-hydrochlorothiazide (ZESTORETIC) 10-12.5 MG tablet, Take 1 tablet by mouth daily., Disp: , Rfl:  .  metroNIDAZOLE (METROGEL) 1 % gel, Apply 1 application topically daily. , Disp: , Rfl:  .  Omega-3 Fatty Acids (FISH OIL) 500 MG CAPS, Take 1 capsule by mouth daily. , Disp: , Rfl:  .  QVAR REDIHALER 80 MCG/ACT inhaler, , Disp: , Rfl:  .  diltiazem (CARDIZEM CD) 120 MG 24 hr capsule, Take by mouth., Disp: , Rfl:   PAST MEDICAL HISTORY: Past Medical History:  Diagnosis Date  . Benign brain tumor ([place])   . Chronic sinusitis   . Hearing loss   . Hypertension   . Multiple sclerosis ([place])   . Raynaud disease   . Vision abnormalities     PAST SURGICAL HISTORY: Past Surgical History:  Procedure Laterality Date  . BRAIN TUMOR EXCISION    . hystrectomy    . NASAL SINUS SURGERY      FAMILY HISTORY: Family History  Problem Relation Age of Onset  . Healthy Mother   . Lung cancer Father     SOCIAL HISTORY:  Social History   Socioeconomic History  . Marital status: Married    Spouse name: Not on file  . Number of children: Not on file  . Years of education: Not on file  . Highest  education level: Not on file  Occupational History  . Not on file  Tobacco Use  . Smoking status: Never Smoker  . Smokeless tobacco: Never Used  Substance and Sexual Activity  . Alcohol use: No    Alcohol/week: 0.0 standard drinks  . Drug use: No  . Sexual activity: Not on file  Other Topics Concern  . Not on file  Social History Narrative  . Not on file   Social Determinants of Health   Financial Resource Strain:   . Difficulty of Paying Living Expenses:   Food Insecurity:   . Worried About Charity fundraiser in the Last Year:   . Arboriculturist in the Last Year:   Transportation Needs:   . Film/video editor (Medical):   Marland Kitchen Lack of Transportation (Non-Medical):   Physical Activity:   . Days of Exercise per Week:   . Minutes of Exercise per Session:   Stress:   . Feeling of Stress :   Social Connections:   . Frequency of Communication with Friends and Family:   . Frequency of Social Gatherings with Friends and Family:   . Attends Religious Services:   . Active Member of Clubs or Organizations:   . Attends Archivist Meetings:   Marland Kitchen Marital Status:   Intimate Partner Violence:   . Fear of Current or Ex-Partner:   . Emotionally Abused:   Marland Kitchen Physically Abused:   . Sexually Abused:      PHYSICAL EXAM  Vitals:   12/06/19 0813  BP: 129/82  Pulse: 90  Temp: 98 F (36.7 C)  Weight: 154 lb 8 oz (70.1 kg)  Height: 5\' 6"  (1.676 m)    Body mass index is 24.94 kg/m.   General: The patient is well-developed and well-nourished and in no acute distress  Skin/Ext: Extremities are without rash or edema.   Neurologic Exam  Mental status: The patient is alert and oriented x 3 at the time of the examination. The patient has apparent normal recent  and remote memory, with an apparently normal attention span and concentration ability.   Speech is normal.  Cranial nerves: Extraocular movements are full.  Facial strength and sensation was normal.  Trapezius  strength was normal..   Hearing appears to be normal.  Motor: Muscle bulk, tone and strength are normal.  Sensory: She had intact sensation to touch and vibration in the arms and legs.  Gait and station: Station is normal.   The gait is mildly wide.  Tandem gait is moderately wide.  Deep tendon reflexes: reflexes were 2 and symmetric in the arms, 3 at the knees and 2 at the ankles.                        ASSESSMENT AND PLAN  Multiple sclerosis ([place]) - Plan: CBC with Differential/Platelet, Hepatic Function Panel  High risk medication use - Plan: CBC with Differential/Platelet, Hepatic Function Panel  Fatty infiltration of liver  Depression with anxiety  Ataxic gait  Dysesthesia   1.   Continue Rebif.   We have discussed switching to Aubagio or Mavenclad.  We will check CBC and liver function test today. 2.   Continue baclofen for spasticity and gabapentin and lamotrigine for dysesthesias and insomnia.   3.   Stay active and exercise as tolerated.   4.  She will return to see me in 6 months or sooner if she has new or worsening neurologic symptoms.  Yasenia Reedy A. Felecia Shelling, MD, PhD [id number], [id number] AM Certified in Neurology, Clinical Neurophysiology, Sleep Medicine, Pain Medicine and Neuroimaging  [facility] [address], [place], [place] [address] [phone]

## 2019-12-07 LAB — HEPATIC FUNCTION PANEL
ALT: 43 IU/L — ABNORMAL HIGH (ref 0–32)
AST: 47 IU/L — ABNORMAL HIGH (ref 0–40)
Albumin: 4.3 g/dL (ref 3.8–4.8)
Alkaline Phosphatase: 137 IU/L — ABNORMAL HIGH (ref 39–117)
Bilirubin Total: 0.2 mg/dL (ref 0.0–1.2)
Bilirubin, Direct: 0.1 mg/dL (ref 0.00–0.40)
Total Protein: 7.3 g/dL (ref 6.0–8.5)

## 2019-12-07 LAB — CBC WITH DIFFERENTIAL/PLATELET
Basophils Absolute: 0 10*3/uL (ref 0.0–0.2)
Basos: 0 %
EOS (ABSOLUTE): 0.7 10*3/uL — ABNORMAL HIGH (ref 0.0–0.4)
Eos: 15 %
Hematocrit: 39.5 % (ref 34.0–46.6)
Hemoglobin: 13.5 g/dL (ref 11.1–15.9)
Immature Grans (Abs): 0 10*3/uL (ref 0.0–0.1)
Immature Granulocytes: 0 %
Lymphocytes Absolute: 0.7 10*3/uL (ref 0.7–3.1)
Lymphs: 15 %
MCH: 31.7 pg (ref 26.6–33.0)
MCHC: 34.2 g/dL (ref 31.5–35.7)
MCV: 93 fL (ref 79–97)
Monocytes Absolute: 0.5 10*3/uL (ref 0.1–0.9)
Monocytes: 11 %
Neutrophils Absolute: 2.8 10*3/uL (ref 1.4–7.0)
Neutrophils: 59 %
Platelets: 315 10*3/uL (ref 150–450)
RBC: 4.26 x10E6/uL (ref 3.77–5.28)
RDW: 12.2 % (ref 11.7–15.4)
WBC: 4.7 10*3/uL (ref 3.4–10.8)

## 2019-12-10 ENCOUNTER — Telehealth: Payer: Self-pay | Admitting: Neurology

## 2019-12-10 NOTE — Telephone Encounter (Signed)
Patient called to report she has a rash and was advised to call office if a rash developed and believe it is due to the  lamoTRIgine (LAMICTAL) 100 MG tablet

## 2019-12-10 NOTE — Addendum Note (Signed)
Addended by: Wyvonnia Lora on: 12/10/2019 01:37 PM   Modules accepted: Orders

## 2019-12-10 NOTE — Telephone Encounter (Signed)
Called pt. Relayed Dr. Garth Bigness recommendation. She will stop the lamotrigine. She did go to the beach this past weekend and spent about an hour in the sun. She got sunburned as well on face/neck/arms. She would still like to stop lamotrigine. She will call back once sx clear up to discuss other med options.  I also went over lab results: "Please let her know that the liver tests (ALT, AST) were mildly elevated. They are not high enough to be concerned about. However, if she is taking Tylenol she should stop and if she drinks alcohol she should try to reduce. We should check this again in about 2 months (hepatic function panel). Or she can do with primary care if she has a visit with them in the next couple months". Scheduled next lab appt for 02/05/20 at 10am. She does not have PCP Appt until August.   Also scheduled 6 month f/u for 06/10/20 at 1pm with Dr. Felecia Shelling.

## 2019-12-10 NOTE — Telephone Encounter (Signed)
She should stop the lamotrigine.  If the dysesthetic/neuropathic pain worsens, we will start a tricyclic such as amitriptyline

## 2019-12-18 MED ORDER — AMITRIPTYLINE HCL 25 MG PO TABS: 25.0000 mg | ORAL_TABLET | Freq: Every day | ORAL | 5 refills | Status: DC

## 2019-12-18 NOTE — Telephone Encounter (Signed)
Called pt back. Relayed Dr. Garth Bigness recommendation, she was agreeable to this. I e-scribed rx to [place], [place].

## 2019-12-18 NOTE — Addendum Note (Signed)
Addended by: Wyvonnia Lora on: 12/18/2019 01:41 PM   Modules accepted: Orders

## 2019-12-18 NOTE — Telephone Encounter (Signed)
Let's start amitriptyline 25 mg qHS #30 #5

## 2019-12-18 NOTE — Telephone Encounter (Signed)
Pt called wanting to know what she can do with this continued pain she is having since she is not able to be on the pain medication. Please advise.

## 2019-12-18 NOTE — Telephone Encounter (Signed)
Called, LVM for pt to call office back. Wanted to see if her rash resolved. If so, Dr. Felecia Shelling recommended another medication called amitriptyline that he could try her on.

## 2019-12-18 NOTE — Telephone Encounter (Signed)
Took call from phone staff and spoke with pt. She verified rash has cleared up up after stopping lamotrigine. She is using accupressure for ice pick sensation which has helped. She is having new sx of pain in her left ankle/cramps that is waking her up at night for the last 3 days. States pain is anterior/lateral to the ankle bone.  She tried taking soma in the middle of the night but it was ineffective. Confirmed she is still taking baclofen 10mg  po QID and gabapentin 600mg  po QID prn. She only takes soma prn. She denies any injuries. Advised I will send message to MD and call back with his recommendation.

## 2020-01-30 ENCOUNTER — Telehealth: Payer: Self-pay | Admitting: Neurology

## 2020-01-30 NOTE — Telephone Encounter (Signed)
Pt has called re: the lab appointment, pt states she doesn't think she wants to take the medication that the lab appointment is for.  Pt is asking for a call from [facility]

## 2020-01-30 NOTE — Telephone Encounter (Signed)
Called pt back. Advised that the reason we were doing repeat labs is because her liver tests were mildly elevated previously and Dr. Felecia Shelling wanted it rechecked in two months. She will keep lab appt for 02/05/20. Nothing further needed.  She has seen improvement, been doing home exercises and rotating inj sites for rebif/uses ice before and after inj.

## 2020-02-05 ENCOUNTER — Other Ambulatory Visit: Payer: Self-pay

## 2020-02-05 ENCOUNTER — Other Ambulatory Visit (INDEPENDENT_AMBULATORY_CARE_PROVIDER_SITE_OTHER): Payer: Self-pay

## 2020-02-05 DIAGNOSIS — G35 Multiple sclerosis: Secondary | ICD-10-CM

## 2020-02-05 DIAGNOSIS — Z0289 Encounter for other administrative examinations: Secondary | ICD-10-CM

## 2020-02-06 LAB — CBC WITH DIFFERENTIAL/PLATELET
Basophils Absolute: 0 10*3/uL (ref 0.0–0.2)
Basos: 1 %
EOS (ABSOLUTE): 0.1 10*3/uL (ref 0.0–0.4)
Eos: 3 %
Hematocrit: 38.5 % (ref 34.0–46.6)
Hemoglobin: 13.1 g/dL (ref 11.1–15.9)
Immature Grans (Abs): 0 10*3/uL (ref 0.0–0.1)
Immature Granulocytes: 0 %
Lymphocytes Absolute: 1 10*3/uL (ref 0.7–3.1)
Lymphs: 21 %
MCH: 31.6 pg (ref 26.6–33.0)
MCHC: 34 g/dL (ref 31.5–35.7)
MCV: 93 fL (ref 79–97)
Monocytes Absolute: 0.6 10*3/uL (ref 0.1–0.9)
Monocytes: 13 %
Neutrophils Absolute: 2.9 10*3/uL (ref 1.4–7.0)
Neutrophils: 62 %
Platelets: 307 10*3/uL (ref 150–450)
RBC: 4.14 x10E6/uL (ref 3.77–5.28)
RDW: 11.8 % (ref 11.7–15.4)
WBC: 4.7 10*3/uL (ref 3.4–10.8)

## 2020-02-06 LAB — HEPATIC FUNCTION PANEL (6)
ALT: 53 IU/L — ABNORMAL HIGH (ref 0–32)
AST: 57 IU/L — ABNORMAL HIGH (ref 0–40)
Albumin: 4.1 g/dL (ref 3.8–4.8)
Alkaline Phosphatase: 135 IU/L — ABNORMAL HIGH (ref 48–121)
Bilirubin Total: 0.3 mg/dL (ref 0.0–1.2)
Bilirubin, Direct: 0.13 mg/dL (ref 0.00–0.40)

## 2020-02-08 ENCOUNTER — Telehealth: Payer: Self-pay | Admitting: Neurology

## 2020-02-08 NOTE — Telephone Encounter (Signed)
I discussed with Ms. Desilva that the liver function tests are still elevated.  She notes that she was diagnosed with fatty liver recently and there is probably some relation.  Of all of her medications, the Rebif is most likely to lead to liver transaminase elevation but she has been on that medication for many years without issue till recently.  Her pain has done much better since starting amitriptyline and we discussed stopping it to see if she can get by without it at this time.

## 2020-02-08 NOTE — Telephone Encounter (Signed)
I called to go over the pab results --- got VM and left message

## 2020-06-10 ENCOUNTER — Encounter: Payer: Self-pay | Admitting: Neurology

## 2020-06-10 ENCOUNTER — Ambulatory Visit: Payer: Medicare PPO | Admitting: Neurology

## 2020-06-10 VITALS — BP 132/80 | HR 68 | Ht 66.0 in | Wt 152.0 lb

## 2020-06-10 DIAGNOSIS — Z79899 Other long term (current) drug therapy: Secondary | ICD-10-CM | POA: Diagnosis not present

## 2020-06-10 DIAGNOSIS — R208 Other disturbances of skin sensation: Secondary | ICD-10-CM

## 2020-06-10 DIAGNOSIS — R5383 Other fatigue: Secondary | ICD-10-CM

## 2020-06-10 DIAGNOSIS — R260 Ataxic gait: Secondary | ICD-10-CM

## 2020-06-10 DIAGNOSIS — D329 Benign neoplasm of meninges, unspecified: Secondary | ICD-10-CM

## 2020-06-10 DIAGNOSIS — G5702 Lesion of sciatic nerve, left lower limb: Secondary | ICD-10-CM

## 2020-06-10 DIAGNOSIS — R531 Weakness: Secondary | ICD-10-CM

## 2020-06-10 DIAGNOSIS — G35 Multiple sclerosis: Secondary | ICD-10-CM | POA: Diagnosis not present

## 2020-06-10 MED ORDER — BUPROPION HCL ER (XL) 300 MG PO TB24: 300.0000 mg | ORAL_TABLET | Freq: Every day | ORAL | 11 refills | Status: DC

## 2020-06-10 NOTE — Progress Notes (Signed)
[facility]  PATIENT: Dorothy Meyer DOB: 1951-08-28  REFERRING DOCTOR OR PCP:  Scarlette Slice  SOURCE: Patient  _________________________________   HISTORICAL  CHIEF COMPLAINT:  Chief Complaint  Patient presents with  . Follow-up    RM 12, alone. Last seen 12/06/19  . Multiple Sclerosis    On Rebif.    HISTORY OF PRESENT ILLNESS:  Dorothy Meyer is a 69 y.o. woman with MS.    She was diagnosed with relapsing remitting MS in 1996.    Update 06/10/2020: She had a few weeks of more severe pain in her left buttock/hip.   She went on amitriptyline for a few weeks.   Pain improved and she d/c the amitriptyline.   She is doing piriformis exercises and feels that they have helped.    She has a mild right foot drop and more spasticity in that leg than the She is noting some proximal weakness and if on floor has trouble getting up without both arms.    Heat seems to be affecting her more.  The right leg is more spastic than the left.   She has a dull left buttock pain.   The combination of gabapentin and lamotrigine.     She is tolerating Rebif well and has no exacerbations.   LFTs were minimally elevated 04/24/20.   We have discussed some other options (Aubagio and Mavenclad) or d/c DMT with serial MRI surveillance if she wants to change.     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   This was treated with gamma knife.   She has a mild hearing loss as sequela.     She notes reduced libido.  We discussed that Wellbutrin might help.  She is already on a low dose of 150 mg and I will increase to 300 mg.  If this does not help she should discuss further with her gynecologist.  She did the Covid 19 vaccination in Jan/Feb and we discussed getting the booster.      MS History:    She was diagnosed with multiple sclerosis in the 1997 after she presented with numbness in both arms. An MRI of the cervical spine showed a plaque at C2C3.   An MRI of the brain at that time  showed other lesions, compatible with the diagnosis of multiple sclerosis. She had only 3 plaques in the brain initially and treatment was deferred. However, one year later she had optic neuritis, confirmed the diagnosis of multiple sclerosis. Initially, she was placed on Avonex. She then tried Copaxonelater on as she was having flulike symptoms with Avonex. However, she had severe skin reactions with Copaxone that was stopped. She then went on Rebif. She has been on Rebif for the past 8 years or so and tolerates it very well. Her MS has been mostly stable while on the Rebif. Last exacerbation was 2015.   Her Rebif dose is 22 g. She tolerates that dose well.    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  She sees at [facility] for this. Her last MRI 10/2015 was stable.   She has had gamma knife.   Imaging: MRI of the brain in 2015 did not show any new lesions.  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 On the cervical spine MRI she has 3 hyperintense foci consistent with demyelinating plaque.  MRI  brain 07/14/2015 showed similar appearance of 10 x 7 mm enhancing extra-axial mass along the left tentorium and projecting infratentorially compared to the scan from 07/16/2013. No new enhancing masses are seen. No significant white matter disease.    11/05/2016 cervical spine MRI shows two T2 hyperintense foci within the spinal cord, anteriorly just below the cervicomedullary junction and posteriorly adjacent to C2-C3.   Neither of these appear to be acute or enhance after contrast is administered.    Moderate degenerative changes causing mild spinal stenosis at C3-C4, C4-C5, C5-C6 and C6-C7. There does not appear to be any nerve root compression.  There is a normal enhancement pattern and there are no acute findings.  11/05/2016 MRI of the thoracic spine showed a normal spinal cord  and some mild degenerative changes without nerve root compression or spinal stenosis.    REVIEW OF SYSTEMS: Constitutional: No fevers, chills, sweats, or change in appetite.   She has fatigue and insomnia.   Eyes: No visual changes, double vision, eye pain Ear, nose and throat: No hearing loss, ear pain, nasal congestion, sore throat Cardiovascular: No chest pain, palpitations Respiratory: No shortness of breath at rest or with exertion.   No wheezes GastrointestinaI: No nausea, vomiting, diarrhea, abdominal pain, fecal incontinence Genitourinary: No dysuria, urinary retention or frequency.  No nocturia. Musculoskeletal: No neck pain, back pain Integumentary: No rash, pruritus, skin lesions Neurological: as above Psychiatric: No current depression at this time.  No anxiety Endocrine: No palpitations, diaphoresis, change in appetite, change in weigh or increased thirst Hematologic/Lymphatic: No anemia, purpura, petechiae. Allergic/Immunologic: No itchy/runny eyes, nasal congestion, recent allergic reactions, rashes  ALLERGIES: Allergies  Allergen Reactions  . Doxycycline Other (See Comments) and Rash    headaches headaches   . Hydrocodone-Acetaminophen Nausea And Vomiting  . Oxycodone Nausea And Vomiting  . Propoxyphene Nausea And Vomiting  . Tramadol Nausea And Vomiting  . Lamotrigine Rash    HOME MEDICATIONS:  Current Outpatient Medications:  .  albuterol (PROVENTIL HFA;VENTOLIN HFA) 108 (90 Base) MCG/ACT inhaler, Inhale into the lungs every 6 (six) hours as needed for wheezing or shortness of breath., Disp: , Rfl:  .  ALPRAZolam (XANAX) 0.5 MG tablet, Take 1 tablet by mouth at bedtime as needed for anxiety, Disp: 30 tablet, Rfl: 5 .  baclofen (LIORESAL) 10 MG tablet, TAKE 1 TABLET BY MOUTH 4  TIMES DAILY, Disp: 360 tablet, Rfl: 3 .  buPROPion (WELLBUTRIN XL) 150 MG 24 hr tablet, TAKE 1 TABLET BY MOUTH  DAILY, Disp: 90 tablet, Rfl: 3 .  carisoprodol (SOMA) 350 MG tablet,  TAKE 1 TABLET BY MOUTH THREE TIMES A DAY AS NEEDED FOR MUSCLE SPASMS, Disp: 90 tablet, Rfl: 5 .  gabapentin (NEURONTIN) 600 MG tablet, Take 1 tablet (600 mg total) by mouth 4 (four) times daily as needed., Disp: 120 tablet, Rfl: 1 .  Interferon Beta-1a (REBIF) 22 MCG/0.5ML SOSY, Inject 22 mcg into the skin 3 (three) times a week. At least 48 hours apart., Disp: 6 mL, Rfl: 11 .  lisinopril-hydrochlorothiazide (ZESTORETIC) 10-12.5 MG tablet, Take 1 tablet by mouth daily., Disp: , Rfl:  .  metroNIDAZOLE (METROGEL) 1 % gel, Apply 1 application topically daily. , Disp: , Rfl:  .  Omega-3 Fatty Acids (FISH OIL) 500 MG CAPS, Take 1 capsule by mouth daily. , Disp: , Rfl:  .  QVAR REDIHALER 80 MCG/ACT inhaler, , Disp: , Rfl:  .  diltiazem (CARDIZEM CD) 120 MG 24 hr capsule, Take by mouth., Disp: , Rfl:  PAST MEDICAL HISTORY: Past Medical History:  Diagnosis Date  . Benign brain tumor ([place])   . Chronic sinusitis   . Hearing loss   . Hypertension   . Multiple sclerosis ([place])   . Raynaud disease   . Vision abnormalities     PAST SURGICAL HISTORY: Past Surgical History:  Procedure Laterality Date  . BRAIN TUMOR EXCISION    . hystrectomy    . NASAL SINUS SURGERY      FAMILY HISTORY: Family History  Problem Relation Age of Onset  . Healthy Mother   . Lung cancer Father     SOCIAL HISTORY:  Social History   Socioeconomic History  . Marital status: Married    Spouse name: Not on file  . Number of children: Not on file  . Years of education: Not on file  . Highest education level: Not on file  Occupational History  . Not on file  Tobacco Use  . Smoking status: Never Smoker  . Smokeless tobacco: Never Used  Substance and Sexual Activity  . Alcohol use: No    Alcohol/week: 0.0 standard drinks  . Drug use: No  . Sexual activity: Not on file  Other Topics Concern  . Not on file  Social History Narrative  . Not on file   Social Determinants of Health   Financial Resource  Strain:   . Difficulty of Paying Living Expenses: Not on file  Food Insecurity:   . Worried About Charity fundraiser in the Last Year: Not on file  . Ran Out of Food in the Last Year: Not on file  Transportation Needs:   . Lack of Transportation (Medical): Not on file  . Lack of Transportation (Non-Medical): Not on file  Physical Activity:   . Days of Exercise per Week: Not on file  . Minutes of Exercise per Session: Not on file  Stress:   . Feeling of Stress : Not on file  Social Connections:   . Frequency of Communication with Friends and Family: Not on file  . Frequency of Social Gatherings with Friends and Family: Not on file  . Attends Religious Services: Not on file  . Active Member of Clubs or Organizations: Not on file  . Attends Archivist Meetings: Not on file  . Marital Status: Not on file  Intimate Partner Violence:   . Fear of Current or Ex-Partner: Not on file  . Emotionally Abused: Not on file  . Physically Abused: Not on file  . Sexually Abused: Not on file     PHYSICAL EXAM  Vitals:   06/10/20 1255  BP: 132/80  Pulse: 68  Weight: 152 lb (68.9 kg)  Height: 5\' 6"  (1.676 m)    Body mass index is 24.53 kg/m.   General: The patient is well-developed and well-nourished and in no acute distress  Skin/Ext: Extremities are without rash or edema.   She is tender over the left piriformis.    Neurologic Exam  Mental status: The patient is alert and oriented x 3 at the time of the examination. The patient has apparent normal recent and remote memory, with an apparently normal attention span and concentration ability.   Speech is normal.  Cranial nerves: Extraocular movements are full.  Facial strength and sensation was normal.  Trapezius strength was normal..   Hearing appears to be normal.  Motor: Muscle bulk, tone and strength are normal.  Sensory: She had intact sensation to touch and vibration in the arms and legs.  Gait and station: Station  is normal.   The gait is mildly wide with a slight right foot drop.  Tandem gait is mildly wide.  Deep tendon reflexes: reflexes were 2 and symmetric in the arms, 3 at the knees and 2 at the ankles.               ASSESSMENT AND PLAN  Multiple sclerosis (HCC)  High risk medication use  Ataxic gait  Dysesthesia  Other fatigue  Meningioma ([place])   1.   Continue Rebif.   We have discussed other options.  We will check an MRI of the brain to determine if there is any subclinical progression.  If this is occurring, I would recommend a switch to a different disease modifying therapy, if no progression she could continue Rebif will switch to a different agent or even consider stopping disease modifying therapy and monitoring future MRIs  2.   Continue baclofen for spasticity and gabapentin and lamotrigine for dysesthesias and insomnia.   3.   Stay active and exercise as tolerated.   4.  Increase Wellbutrin to 300 g.   5.  She will return to see me in 6 months or sooner if she has new or worsening neurologic symptoms.  Arushi Partridge A. Felecia Shelling, MD, PhD 74/71/5953, 9:67 PM Certified in Neurology, Clinical Neurophysiology, Sleep Medicine, Pain Medicine and Neuroimaging  [facility] [address], [place], [place] [address] [phone]

## 2020-06-11 ENCOUNTER — Telehealth: Payer: Self-pay | Admitting: Neurology

## 2020-06-11 NOTE — Telephone Encounter (Signed)
Dorothy Meyer Dorothy Meyer: [id number] (exp. 06/11/20 to 07/11/20) order sent to GI. They will reach out to the patient to schedule.

## 2020-06-13 ENCOUNTER — Other Ambulatory Visit: Payer: Self-pay | Admitting: Neurology

## 2020-06-24 ENCOUNTER — Ambulatory Visit: Payer: Self-pay | Admitting: Rehabilitative and Restorative Service Providers"

## 2020-08-04 ENCOUNTER — Other Ambulatory Visit: Payer: Self-pay | Admitting: Neurology

## 2020-08-21 ENCOUNTER — Other Ambulatory Visit: Payer: Self-pay

## 2020-08-21 ENCOUNTER — Ambulatory Visit
Admission: RE | Admit: 2020-08-21 | Discharge: 2020-08-21 | Disposition: A | Discharge status: Home / Self Care | Payer: Medicare PPO | Source: Ambulatory Visit | Attending: Neurology | Admitting: Neurology

## 2020-08-21 DIAGNOSIS — G35 Multiple sclerosis: Secondary | ICD-10-CM

## 2020-08-21 MED ORDER — GADOBENATE DIMEGLUMINE 529 MG/ML IV SOLN
14.0000 mL | Freq: Once | INTRAVENOUS | Status: AC | PRN
  Administered 2020-08-21: 14 mL via INTRAVENOUS

## 2020-08-25 ENCOUNTER — Other Ambulatory Visit: Payer: Self-pay | Admitting: Neurology

## 2020-08-27 NOTE — Progress Notes (Signed)
Kindly inform the patient that MRI scan of the brain shows presence of a few spots in the upper cervical spinal cord which is unchanged from previous MRI which may represent old lesions of multiple sclerosis.  Small benign tumor on the left at the base of the brain is also unchanged from previous MRI from 2016.  No new or worrisome findings

## 2020-08-28 ENCOUNTER — Telehealth: Payer: Self-pay | Admitting: *Deleted

## 2020-08-28 NOTE — Telephone Encounter (Signed)
-----   Message from Micki Riley, MD sent at 08/27/2020  4:35 PM EST ----- Kindly inform the patient that MRI scan of the brain shows presence of a few spots in the upper cervical spinal cord which is unchanged from previous MRI which may represent old lesions of multiple sclerosis.  Small benign tumor on the left at the base of the brain is also unchanged from previous MRI from 2016.  No new or worrisome findings

## 2020-08-28 NOTE — Telephone Encounter (Signed)
Left patient a detailed message, with results, on voicemail (ok per DPR).  Provided our number to call back with any questions.  

## 2020-09-26 ENCOUNTER — Other Ambulatory Visit: Payer: Self-pay | Admitting: Neurology

## 2020-09-30 ENCOUNTER — Other Ambulatory Visit: Payer: Self-pay | Admitting: *Deleted

## 2020-09-30 MED ORDER — BUPROPION HCL ER (XL) 300 MG PO TB24: 300.0000 mg | ORAL_TABLET | Freq: Every day | ORAL | 2 refills | Status: DC

## 2020-10-25 ENCOUNTER — Other Ambulatory Visit: Payer: Self-pay | Admitting: Neurology

## 2020-10-25 DIAGNOSIS — G35 Multiple sclerosis: Secondary | ICD-10-CM

## 2020-12-11 ENCOUNTER — Encounter: Payer: Self-pay | Admitting: Neurology

## 2020-12-11 ENCOUNTER — Ambulatory Visit: Payer: Medicare PPO | Admitting: Neurology

## 2020-12-11 VITALS — BP 105/70 | HR 81 | Ht 66.0 in | Wt 147.5 lb

## 2020-12-11 DIAGNOSIS — R260 Ataxic gait: Secondary | ICD-10-CM | POA: Diagnosis not present

## 2020-12-11 DIAGNOSIS — Z79899 Other long term (current) drug therapy: Secondary | ICD-10-CM

## 2020-12-11 DIAGNOSIS — D329 Benign neoplasm of meninges, unspecified: Secondary | ICD-10-CM

## 2020-12-11 DIAGNOSIS — G35 Multiple sclerosis: Secondary | ICD-10-CM | POA: Diagnosis not present

## 2020-12-11 DIAGNOSIS — R208 Other disturbances of skin sensation: Secondary | ICD-10-CM

## 2020-12-11 MED ORDER — CARISOPRODOL 350 MG PO TABS: ORAL_TABLET | ORAL | 5 refills | Status: DC

## 2020-12-11 NOTE — Progress Notes (Signed)
[facility]  PATIENT: Dorothy Meyer DOB: 19-May-1951  REFERRING DOCTOR OR PCP:  Scarlette Slice  SOURCE: Patient  _________________________________   HISTORICAL  CHIEF COMPLAINT:  Chief Complaint  Patient presents with  . Follow-up    RM 13, alone. Last seen 06/10/2020. On Rebif for MS. Doing well, no new sx.    HISTORY OF PRESENT ILLNESS:  Dorothy Meyer is a 70 y.o. woman with MS.    She was diagnosed with relapsing remitting MS in 1996.    Update 12/11/2020: She is tolerating Rebif well and has no exacerbations x several years.  We did do some IV steroid after she had eye pain similar to her episode of ON though vision was just slightly affected.     LFTs were minimally elevated 04/24/20.   We have discussed some other options (Aubagio and Mavenclad) or d/c DMT with serial MRI surveillance if she wants to change.     She has a mild right foot drop and more spasticity in that leg than the left.   People comment on her gait and it worsens with longer distance.   She is noting some proximal weakness and if on floor has trouble getting up without both arms.     She has a dull left buttock pain.   The combination of gabapentin and lamotrigine helps dysesthetic burning sensation.    She is having more mid back tightthess, worse if she stands a long time.    A carisoprodol helps and she takes one occasionally.   The alprazolam also helps.   She notes right hip pain when she walks long.   Sometimes she increass the gabapentin to 4/day if more LBP to radicular like pain.       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   This was treated with gamma knife.   She has a mild hearing loss as sequela.     Mood is doing well.   She is noting more stress.   Her father in law (53 yo) and brother in law have moved in, both with medical issues.       She did the Covid 19 vaccination in Jan/Feb and we discussed getting the booster.      MS History:    She was diagnosed with  multiple sclerosis in the 1997 after she presented with numbness in both arms. An MRI of the cervical spine showed a plaque at C2C3.   An MRI of the brain at that time showed other lesions, compatible with the diagnosis of multiple sclerosis. She had only 3 plaques in the brain initially and treatment was deferred. However, one year later she had optic neuritis, confirmed the diagnosis of multiple sclerosis. Initially, she was placed on Avonex. She then tried Copaxonelater on as she was having flulike symptoms with Avonex. However, she had severe skin reactions with Copaxone that was stopped. She then went on Rebif. She has been on Rebif for the past 8 years or so and tolerates it very well. Her MS has been mostly stable while on the Rebif. Last exacerbation was 2015.   Her Rebif dose is 22 g. She tolerates that dose well.    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  She sees at [facility] for this. Her last MRI 10/2015 was stable.   She has had gamma knife.   Imaging: MRI of the brain in 2015  did not show any new lesions.  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 On the cervical spine MRI she has 3 hyperintense foci consistent with demyelinating plaque.  MRI brain 07/14/2015 showed similar appearance of 10 x 7 mm enhancing extra-axial mass along the left tentorium and projecting infratentorially compared to the scan from 07/16/2013. No new enhancing masses are seen. No significant white matter disease.    11/05/2016 cervical spine MRI shows two T2 hyperintense foci within the spinal cord, anteriorly just below the cervicomedullary junction and posteriorly adjacent to C2-C3.   Neither of these appear to be acute or enhance after contrast is administered.    Moderate degenerative changes causing mild spinal stenosis at C3-C4, C4-C5, C5-C6 and C6-C7. There does not appear to  be any nerve root compression.  There is a normal enhancement pattern and there are no acute findings.  11/05/2016 MRI of the thoracic spine showed a normal spinal cord and some mild degenerative changes without nerve root compression or spinal stenosis.  MRI brain 08/22/2020 showed scattered T2/FLAIR hyperintense foci in the hemispheres and 2 FLAIR hyperintense foci in the spinal cord.  None of these enhance or appear to be acute.  They were present on previous MRIs from 2016 and 2018.  Although the brain foci are very nonspecific and could represent chronic   microvascular ischemic change or demyelination, the 2 lesions in the upper cervical spine are consistent with the clinical diagnosis of multiple sclerosis. Also,    10 x 8 mm meningioma at the left cerebellopontine angle unchanged in appearance compared to the 07/14/2015 MRI  REVIEW OF SYSTEMS: Constitutional: No fevers, chills, sweats, or change in appetite.   She has fatigue and insomnia.   Eyes: No visual changes, double vision, eye pain Ear, nose and throat: No hearing loss, ear pain, nasal congestion, sore throat Cardiovascular: No chest pain, palpitations Respiratory: No shortness of breath at rest or with exertion.   No wheezes GastrointestinaI: No nausea, vomiting, diarrhea, abdominal pain, fecal incontinence Genitourinary: No dysuria, urinary retention or frequency.  No nocturia. Musculoskeletal: No neck pain, back pain Integumentary: No rash, pruritus, skin lesions Neurological: as above Psychiatric: No current depression at this time.  No anxiety Endocrine: No palpitations, diaphoresis, change in appetite, change in weigh or increased thirst Hematologic/Lymphatic: No anemia, purpura, petechiae. Allergic/Immunologic: No itchy/runny eyes, nasal congestion, recent allergic reactions, rashes  ALLERGIES: Allergies  Allergen Reactions  . Doxycycline Other (See Comments) and Rash    headaches headaches   .  Hydrocodone-Acetaminophen Nausea And Vomiting  . Oxycodone Nausea And Vomiting  . Propoxyphene Nausea And Vomiting  . Tramadol Nausea And Vomiting  . Lamotrigine Rash    HOME MEDICATIONS:  Current Outpatient Medications:  .  albuterol (PROVENTIL HFA;VENTOLIN HFA) 108 (90 Base) MCG/ACT inhaler, Inhale into the lungs every 6 (six) hours as needed for wheezing or shortness of breath., Disp: , Rfl:  .  ALPRAZolam (XANAX) 0.5 MG tablet, TAKE 1 TABLET BY MOUTH AT BEDTIME AS NEEDED FOR ANXIETY, Disp: 30 tablet, Rfl: 5 .  baclofen (LIORESAL) 10 MG tablet, TAKE 1 TABLET FOUR TIMES DAILY, Disp: 360 tablet, Rfl: 3 .  buPROPion (WELLBUTRIN XL) 300 MG 24 hr tablet, Take 1 tablet (300 mg total) by mouth daily., Disp: 90 tablet, Rfl: 2 .  gabapentin (NEURONTIN) 600 MG tablet, Take 1 tablet (600 mg total) by mouth 4 (four) times daily as needed., Disp: 360 tablet, Rfl: 0 .  lisinopril-hydrochlorothiazide (  ZESTORETIC) 10-12.5 MG tablet, Take 1 tablet by mouth daily., Disp: , Rfl:  .  metroNIDAZOLE (METROGEL) 1 % gel, Apply 1 application topically daily. , Disp: , Rfl:  .  Omega-3 Fatty Acids (FISH OIL) 500 MG CAPS, Take 1 capsule by mouth daily. , Disp: , Rfl:  .  QVAR REDIHALER 80 MCG/ACT inhaler, , Disp: , Rfl:  .  REBIF 22 MCG/0.5ML SOSY, INJECT 22MCG SUBCUTANEOUSLY 3 TIMES A WEEK AT LEAST 48 HOURS APART, Disp: 6 mL, Rfl: 11 .  carisoprodol (SOMA) 350 MG tablet, Take 1 tablet by mouth three times daily as needed for muscle spasm, Disp: 90 tablet, Rfl: 5 .  diltiazem (CARDIZEM CD) 120 MG 24 hr capsule, Take by mouth., Disp: , Rfl:   PAST MEDICAL HISTORY: Past Medical History:  Diagnosis Date  . Benign brain tumor ([place])   . Chronic sinusitis   . Hearing loss   . Hypertension   . Multiple sclerosis ([place])   . Raynaud disease   . Vision abnormalities     PAST SURGICAL HISTORY: Past Surgical History:  Procedure Laterality Date  . BRAIN TUMOR EXCISION    . hystrectomy    . NASAL SINUS SURGERY       FAMILY HISTORY: Family History  Problem Relation Age of Onset  . Healthy Mother   . Lung cancer Father     SOCIAL HISTORY:  Social History   Socioeconomic History  . Marital status: Married    Spouse name: Not on file  . Number of children: Not on file  . Years of education: Not on file  . Highest education level: Not on file  Occupational History  . Not on file  Tobacco Use  . Smoking status: Never Smoker  . Smokeless tobacco: Never Used  Substance and Sexual Activity  . Alcohol use: No    Alcohol/week: 0.0 standard drinks  . Drug use: No  . Sexual activity: Not on file  Other Topics Concern  . Not on file  Social History Narrative  . Not on file   Social Determinants of Health   Financial Resource Strain: Not on file  Food Insecurity: Not on file  Transportation Needs: Not on file  Physical Activity: Not on file  Stress: Not on file  Social Connections: Not on file  Intimate Partner Violence: Not on file     PHYSICAL EXAM  Vitals:   12/11/20 0959  BP: 105/70  Pulse: 81  Weight: 147 lb 8 oz (66.9 kg)  Height: 5\' 6"  (1.676 m)    Body mass index is 23.81 kg/m.   General: The patient is well-developed and well-nourished and in no acute distress  Skin/Ext: Extremities are without rash or edema.   She is tender over the left piriformis.    Neurologic Exam  Mental status: The patient is alert and oriented x 3 at the time of the examination. The patient has apparent normal recent and remote memory, with an apparently normal attention span and concentration ability.   Speech is normal.  Cranial nerves: Extraocular movements are full.  Facial strength and sensation was normal.  Trapezius strength was normal..   Hearing appears to be normal.  Motor: Muscle bulk, tone and strength are normal.  Sensory: She had intact sensation to touch and vibration in the arms and legs.  Gait and station: Station is normal.   The gait is mildly wide with a slight  right foot drop.  Tandem gait is mildly wide.  Deep tendon reflexes:  reflexes were 2 and symmetric in the arms, 3 at the knees and 2 at the ankles.               ASSESSMENT AND PLAN  Multiple sclerosis (HCC)  Meningioma (HCC)  High risk medication use  Ataxic gait  Dysesthesia   1.   Continue Rebif.   We have discussed other options.  We will check an MRI of the brain to determine if there is any subclinical progression.  If this is occurring, I would recommend a switch to a different disease modifying therapy, if no progression she could continue Rebif will switch to a different agent or even consider stopping disease modifying therapy and monitoring future MRIs  2.   Continue baclofen and Soma for spasticity and gabapentin and lamotrigine for dysesthesias and insomnia.   3.   Stay active and exercise as tolerated.   4.  Continue Wellbutrin to 300 g.   5.  She will return to see me in 6 months or sooner if she has new or worsening neurologic symptoms.  35 - minute office visit with the majority of the time spent face-to-face for history and physical, discussion/counseling and decision-making.  Additional time with record review and documentation.   Jacia Sickman A. Felecia Shelling, MD, PhD 10/01/5425, 0:62 PM Certified in Neurology, Clinical Neurophysiology, Sleep Medicine, Pain Medicine and Neuroimaging  [facility] [address] [place], [place] [address] [phone]

## 2021-01-07 ENCOUNTER — Other Ambulatory Visit: Payer: Self-pay | Admitting: Neurology

## 2021-01-12 ENCOUNTER — Other Ambulatory Visit: Payer: Self-pay | Admitting: Neurology

## 2021-04-06 ENCOUNTER — Telehealth: Payer: Self-pay | Admitting: Neurology

## 2021-04-06 DIAGNOSIS — G35 Multiple sclerosis: Secondary | ICD-10-CM

## 2021-04-06 DIAGNOSIS — R52 Pain, unspecified: Secondary | ICD-10-CM

## 2021-04-06 MED ORDER — METHYLPREDNISOLONE 4 MG PO TBPK: ORAL_TABLET | ORAL | 0 refills | Status: DC

## 2021-04-06 NOTE — Telephone Encounter (Signed)
Called pt back. Sx started around 07/18/-07/21 after being in the car off and on for 8 hours during this time. Pain located on left side in leg/sciatic.  She has tried ice/heat, Piriformis exercises from online, taking gabapentin and baclofen as prescribed. Has not taken soma. Denies any injury. Advised I will discuss w/ MD and call back.

## 2021-04-06 NOTE — Telephone Encounter (Signed)
Called pt back. Advised per Dr. Felecia Shelling that he would like to have her try steroid pack for sx. If not better in a few days, should call back. I e-scribed to [place], [place]. She verbalized understanding.

## 2021-04-06 NOTE — Telephone Encounter (Signed)
Pt called requesting a call back  having some major leg pain and sciatic nerve pain as well.  Please advise.

## 2021-04-14 MED ORDER — MELOXICAM 7.5 MG PO TABS: 7.5000 mg | ORAL_TABLET | Freq: Every day | ORAL | 2 refills | Status: DC

## 2021-04-14 NOTE — Addendum Note (Signed)
Addended by: Darleen Crocker on: 04/14/2021 12:13 PM   Modules accepted: Orders

## 2021-04-14 NOTE — Telephone Encounter (Signed)
Called the patient back and advised that Dr Felecia Shelling recommends that we have imaging by xray on the lumbar/sacrum area since the pain has persist despite a steroid.  Advised he would offer mobic 7.5 mg as well for the pt to take daily to see if she notes benefit.

## 2021-04-14 NOTE — Telephone Encounter (Signed)
Pt called stating that she is still having the pain and is wanting to speak to the nurse about it. Please advise.

## 2021-04-14 NOTE — Telephone Encounter (Signed)
Pt has continued to have pain in the sacral area and not having relief. She went to [facility] on 8th and steroids that were prescribed was started on 9th. She has completed the course. She did have to sit on the 6 hr drive back so that may have continued to aggravate the area. The pain is still present and causing increase pain in sitting and laying positions.  She has not had any imaging completed in the area where the pain is. She denies any injury that could have caused this.   This entire event started to become more consistent in pain in July after 8 hr trip to a funeral in [place]. Advised the pt would discuss with Dr Felecia Shelling and see what next steps would be.

## 2021-04-20 ENCOUNTER — Ambulatory Visit (HOSPITAL_COMMUNITY)
Admission: RE | Admit: 2021-04-20 | Discharge: 2021-04-20 | Disposition: A | Discharge status: Home / Self Care | Payer: Medicare PPO | Source: Ambulatory Visit | Attending: Neurology | Admitting: Neurology

## 2021-04-20 ENCOUNTER — Ambulatory Visit
Admission: RE | Admit: 2021-04-20 | Discharge: 2021-04-20 | Disposition: A | Discharge status: Home / Self Care | Payer: Medicare PPO | Source: Ambulatory Visit | Attending: Neurology | Admitting: Neurology

## 2021-04-20 ENCOUNTER — Encounter (HOSPITAL_COMMUNITY): Payer: Self-pay

## 2021-04-20 ENCOUNTER — Other Ambulatory Visit: Payer: Self-pay

## 2021-04-20 DIAGNOSIS — R52 Pain, unspecified: Secondary | ICD-10-CM

## 2021-04-20 DIAGNOSIS — G35 Multiple sclerosis: Secondary | ICD-10-CM

## 2021-04-21 ENCOUNTER — Telehealth: Payer: Self-pay | Admitting: Neurology

## 2021-04-21 DIAGNOSIS — R52 Pain, unspecified: Secondary | ICD-10-CM

## 2021-04-21 DIAGNOSIS — G35 Multiple sclerosis: Secondary | ICD-10-CM

## 2021-04-21 DIAGNOSIS — R208 Other disturbances of skin sensation: Secondary | ICD-10-CM

## 2021-04-21 NOTE — Telephone Encounter (Signed)
Called the patient to review the xray results with her. Advised of the findings and recommendations from Dr Felecia Shelling. Advised Dr Felecia Shelling recommend the patient have a MRI lumbar spine to see if there is impingement of nerves. Pt verbalized understanding and was agreeable to the MRI being done. Pt states that she has been working on exercises at home to relieve the discomfort and has not worked. Informed her that Dr Felecia Shelling states she can increase the meloxicam to two tablets. Pt verbalized understanding.

## 2021-04-21 NOTE — Telephone Encounter (Signed)
-----   Message from Britt Bottom, MD sent at 04/21/2021  9:55 AM EDT ----- Please let her know the XRay of the lumbar spine showed advanced arthritic/disc changes at the bottom two levels.  He sacrum was fine.   Her pain is likelycoming from the lower lumbar degeneration       She can increase the meloxican to two pills (each is 7.5).    We can order MRi lumbar to determine if any pinched nerves

## 2021-04-23 ENCOUNTER — Telehealth: Payer: Self-pay | Admitting: Neurology

## 2021-04-23 NOTE — Telephone Encounter (Signed)
Dorothy Meyer: [id number] (exp. 04/23/21 to 05/23/21) order sent to GI. They will reach out to the patient to schedule.

## 2021-04-27 ENCOUNTER — Ambulatory Visit
Admission: RE | Admit: 2021-04-27 | Discharge: 2021-04-27 | Disposition: A | Discharge status: Home / Self Care | Payer: Medicare PPO | Source: Ambulatory Visit | Attending: Neurology | Admitting: Neurology

## 2021-04-27 ENCOUNTER — Other Ambulatory Visit: Payer: Self-pay

## 2021-04-27 DIAGNOSIS — G35 Multiple sclerosis: Secondary | ICD-10-CM

## 2021-04-27 DIAGNOSIS — R52 Pain, unspecified: Secondary | ICD-10-CM

## 2021-04-27 DIAGNOSIS — R208 Other disturbances of skin sensation: Secondary | ICD-10-CM

## 2021-05-07 ENCOUNTER — Other Ambulatory Visit: Payer: Self-pay | Admitting: Neurology

## 2021-05-09 ENCOUNTER — Other Ambulatory Visit: Payer: Self-pay | Admitting: Neurology

## 2021-05-11 ENCOUNTER — Telehealth: Payer: Self-pay | Admitting: Neurology

## 2021-05-11 NOTE — Telephone Encounter (Signed)
Pt is asking for a call to discuss her continued pain and the results to her MRI.  Pt aware of upcoming appointment but does not feel she can wait that long for something to be done re: her pain.

## 2021-05-11 NOTE — Telephone Encounter (Signed)
Dr. Felecia Shelling had an office visit open up on 05/12/21. The patient accepted the appt and will check in at 3:30pm for the 4pm follow up.

## 2021-05-12 ENCOUNTER — Ambulatory Visit: Payer: Medicare PPO | Admitting: Neurology

## 2021-05-18 ENCOUNTER — Encounter: Payer: Self-pay | Admitting: Neurology

## 2021-05-18 ENCOUNTER — Ambulatory Visit: Payer: Medicare PPO | Admitting: Neurology

## 2021-05-18 ENCOUNTER — Other Ambulatory Visit: Payer: Self-pay

## 2021-05-18 VITALS — BP 137/83 | HR 73 | Ht 66.0 in | Wt 152.0 lb

## 2021-05-18 DIAGNOSIS — R52 Pain, unspecified: Secondary | ICD-10-CM | POA: Diagnosis not present

## 2021-05-18 DIAGNOSIS — R5383 Other fatigue: Secondary | ICD-10-CM

## 2021-05-18 DIAGNOSIS — M5431 Sciatica, right side: Secondary | ICD-10-CM | POA: Diagnosis not present

## 2021-05-18 DIAGNOSIS — D329 Benign neoplasm of meninges, unspecified: Secondary | ICD-10-CM

## 2021-05-18 DIAGNOSIS — G5702 Lesion of sciatic nerve, left lower limb: Secondary | ICD-10-CM | POA: Diagnosis not present

## 2021-05-18 DIAGNOSIS — G35 Multiple sclerosis: Secondary | ICD-10-CM

## 2021-05-18 NOTE — Progress Notes (Signed)
[facility]  PATIENT: Dorothy Meyer DOB: 03/21/1951  REFERRING DOCTOR OR PCP:  Scarlette Slice  SOURCE: Patient  _________________________________   HISTORICAL  CHIEF COMPLAINT:  Chief Complaint  Patient presents with   Follow-up    RM 1, alone. Lumbar disc bulging.    HISTORY OF PRESENT ILLNESS:  Dorothy Meyer is a 70 y.o. woman with MS.    She was diagnosed with relapsing remitting MS in 1996.    Update 05/18/2021: She stopped the Rebif earlier this year.  So far she is doing well and denies any new MS related symptoms.  We will check an MRI to determine if there is any subclinical progression next year.   She has a mild right foot drop and more spasticity in that leg than the left.   This is stable since the last visit..   She is noting some proximal weakness and if on floor has trouble getting up without both arms.       She has a dull left buttock pain that can be intense at times..   Massage made her feel a little worse.   Sitting on the left buttock increases the pain.   Ice and heat help slightly.   Soma helps some.     The alprazolam also helps.   She has right hip pain when she walks long.   Sometimes she increass the gabapentin to 4/day if more LBP to radicular like pain.   She starts PT next week.       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   This was treated with gamma knife.   She has a mild hearing loss as sequela.     Mood is doing well.      She did the Covid 19 vaccination in Jan/Feb and we discussed getting the booster.   She has not had Covid-19   MS History:    She was diagnosed with multiple sclerosis in the 1997 after she presented with numbness in both arms. An MRI of the cervical spine showed a plaque at C2C3.   An MRI of the brain at that time showed other lesions, compatible with the diagnosis of multiple sclerosis. She had only 3 plaques in the brain initially and treatment was deferred. However, one year later she had optic  neuritis, confirmed the diagnosis of multiple sclerosis. Initially, she was placed on Avonex. She then tried Copaxonelater on as she was having flulike symptoms with Avonex. However, she had severe skin reactions with Copaxone that was stopped. She then went on Rebif. She has been on Rebif for the past 8 years or so and tolerates it very well. Her MS has been mostly stable while on the Rebif. Last exacerbation was 2015.   Her Rebif dose is 22 g. She tolerates that dose well.    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  She sees at [facility] for [facility]. Her last MRI 10/2015 was stable.   She has had gamma knife.   Imaging: MRI of the brain in 2015 did not show any new lesions.  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 On the cervical spine MRI she has 3 hyperintense foci consistent with demyelinating plaque.  MRI brain 07/14/2015 showed similar appearance of 10 x 7 mm enhancing extra-axial mass along the left tentorium and projecting infratentorially compared to  the scan from 07/16/2013. No new enhancing masses are seen. No significant white matter disease.    11/05/2016 cervical spine MRI shows two T2 hyperintense foci within the spinal cord, anteriorly just below the cervicomedullary junction and posteriorly adjacent to C2-C3.   Neither of these appear to be acute or enhance after contrast is administered.    Moderate degenerative changes causing mild spinal stenosis at C3-C4, C4-C5, C5-C6 and C6-C7. There does not appear to be any nerve root compression.  There is a normal enhancement pattern and there are no acute findings.  11/05/2016 MRI of the thoracic spine showed a normal spinal cord and some mild degenerative changes without nerve root compression or spinal stenosis.  MRI brain 08/22/2020 showed scattered T2/FLAIR hyperintense foci in the hemispheres and 2 FLAIR  hyperintense foci in the spinal cord.  None of these enhance or appear to be acute.  They were present on previous MRIs from 2016 and 2018.  Although the brain foci are very nonspecific and could represent chronic   microvascular ischemic change or demyelination, the 2 lesions in the upper cervical spine are consistent with the clinical diagnosis of multiple sclerosis. Also,    10 x 8 mm meningioma at the left cerebellopontine angle unchanged in appearance compared to the 07/14/2015 MRI  REVIEW OF SYSTEMS: Constitutional: No fevers, chills, sweats, or change in appetite.   She has fatigue and insomnia.   Eyes: No visual changes, double vision, eye pain Ear, nose and throat: No hearing loss, ear pain, nasal congestion, sore throat Cardiovascular: No chest pain, palpitations Respiratory:  No shortness of breath at rest or with exertion.   No wheezes GastrointestinaI: No nausea, vomiting, diarrhea, abdominal pain, fecal incontinence Genitourinary:  No dysuria, urinary retention or frequency.  No nocturia. Musculoskeletal:  No neck pain, back pain Integumentary: No rash, pruritus, skin lesions Neurological: as above Psychiatric: No current depression at this time.  No anxiety Endocrine: No palpitations, diaphoresis, change in appetite, change in weigh or increased thirst Hematologic/Lymphatic:  No anemia, purpura, petechiae. Allergic/Immunologic: No itchy/runny eyes, nasal congestion, recent allergic reactions, rashes  ALLERGIES: Allergies  Allergen Reactions   Doxycycline Other (See Comments) and Rash    headaches headaches    Hydrocodone-Acetaminophen Nausea And Vomiting   Oxycodone Nausea And Vomiting   Propoxyphene Nausea And Vomiting   Tramadol Nausea And Vomiting   Lamotrigine Rash    HOME MEDICATIONS:  Current Outpatient Medications:    albuterol (PROVENTIL HFA;VENTOLIN HFA) 108 (90 Base) MCG/ACT inhaler, Inhale into the lungs every 6 (six) hours as needed for wheezing or  shortness of breath., Disp: , Rfl:    ALPRAZolam (XANAX) 0.5 MG tablet, TAKE 1 TABLET BY MOUTH AT BEDTIME AS NEEDED FOR ANXIETY, Disp: 30 tablet, Rfl: 5   baclofen (LIORESAL) 10 MG tablet, TAKE 1 TABLET FOUR TIMES DAILY, Disp: 360 tablet, Rfl: 3   buPROPion (WELLBUTRIN XL) 300 MG 24 hr tablet, TAKE 1 TABLET EVERY DAY, Disp: 90 tablet, Rfl: 2   carisoprodol (SOMA) 350 MG tablet, Take 1 tablet by mouth three times daily as needed for muscle spasm, Disp: 90 tablet, Rfl: 5   gabapentin (NEURONTIN) 600 MG tablet, TAKE 1 TABLET FOUR TIMES DAILY AS NEEDED, Disp: 360 tablet, Rfl: 3   lisinopril-hydrochlorothiazide (ZESTORETIC) 10-12.5 MG tablet, Take 1 tablet by mouth daily., Disp: , Rfl:    meloxicam (MOBIC) 7.5 MG tablet, Take 1 tablet (7.5 mg total) by mouth daily., Disp: 30 tablet, Rfl: 2   metroNIDAZOLE (METROGEL) 1 % gel,  Apply 1 application topically daily. , Disp: , Rfl:    Omega-3 Fatty Acids (FISH OIL) 500 MG CAPS, Take 1 capsule by mouth daily. , Disp: , Rfl:    QVAR REDIHALER 80 MCG/ACT inhaler, , Disp: , Rfl:    diltiazem (CARDIZEM CD) 120 MG 24 hr capsule, Take by mouth., Disp: , Rfl:   PAST MEDICAL HISTORY: Past Medical History:  Diagnosis Date   Benign brain tumor ([place])    Chronic sinusitis    Hearing loss    Hypertension    Multiple sclerosis ([place])    Raynaud disease    Vision abnormalities     PAST SURGICAL HISTORY: Past Surgical History:  Procedure Laterality Date   BRAIN TUMOR EXCISION     hystrectomy     NASAL SINUS SURGERY      FAMILY HISTORY: Family History  Problem Relation Age of Onset   Healthy Mother    Lung cancer Father     SOCIAL HISTORY:  Social History   Socioeconomic History   Marital status: Married    Spouse name: Not on file   Number of children: Not on file   Years of education: Not on file   Highest education level: Not on file  Occupational History   Not on file  Tobacco Use   Smoking status: Never   Smokeless tobacco: Never   Substance and Sexual Activity   Alcohol use: No    Alcohol/week: 0.0 standard drinks   Drug use: No   Sexual activity: Not on file  Other Topics Concern   Not on file  Social History Narrative   Not on file   Social Determinants of Health   Financial Resource Strain: Not on file  Food Insecurity: Not on file  Transportation Needs: Not on file  Physical Activity: Not on file  Stress: Not on file  Social Connections: Not on file  Intimate Partner Violence: Not on file     PHYSICAL EXAM  Vitals:   05/18/21 1610  BP: 137/83  Pulse: 73  Weight: 152 lb (68.9 kg)  Height: '5\' 6"'$  (1.676 m)    Body mass index is 24.53 kg/m.   General: The patient is well-developed and well-nourished and in no acute distress  Skin/Ext: Extremities are without rash or edema.   She is moderately tender over the left piriformis muscle.  No tenderness over the lumbar paraspinal muscles or the trochanteric bursae.Marland Kitchen    Neurologic Exam  Mental status: The patient is alert and oriented x 3 at the time of the examination. The patient has apparent normal recent and remote memory, with an apparently normal attention span and concentration ability.   Speech is normal.  Cranial nerves: Extraocular movements are full.  Facial strength and sensation was normal.  Trapezius strength was normal..   Hearing appears to be normal.  Motor: Muscle bulk, tone and strength are normal.  Sensory: She had intact sensation to touch and vibration in the arms and legs.  Gait and station: Station is normal.   The gait is mildly wide with a slight right foot drop.  Tandem gait is mildly wide.  Deep tendon reflexes: reflexes were 2 and symmetric in the arms, 3 at the knees and 2 at the ankles.               ASSESSMENT AND PLAN  Multiple sclerosis ([place])  Pain aggravated by sitting  Piriformis syndrome, left  Meningioma ([facility])  Other fatigue   1.   She will  remain off Rebif.  Sometime next year we will check  an MRI of the brain to determine if there is any subclinical progression.  If this is occurring we would need to reconsider.   2.   Continue baclofen and Soma for spasticity and gabapentin and lamotrigine for dysesthesias and insomnia.   3.   Stay active and exercise as tolerated.   4.  Piriformis muscle trigger point injection with 80 mg Depo-Medrol and 3 cc Marcaine.   5.  She will return to see me in 6 months or sooner if she has new or worsening neurologic sympto  Shelaine Frie A. Felecia Shelling, MD, PhD [id number], [id number] PM Certified in Neurology, Clinical Neurophysiology, Sleep Medicine, Pain Medicine and Neuroimaging  [facility] [address] [place], [place] [address] [phone]

## 2021-06-09 ENCOUNTER — Telehealth: Payer: Self-pay | Admitting: *Deleted

## 2021-06-09 NOTE — Telephone Encounter (Signed)
Received PA baclofen from [facility] via fax. Key: [id number]. I tried completing but received the following response: "Available without authorization."

## 2021-06-10 ENCOUNTER — Telehealth: Payer: Self-pay | Admitting: Neurology

## 2021-06-10 ENCOUNTER — Other Ambulatory Visit: Payer: Self-pay | Admitting: *Deleted

## 2021-06-10 MED ORDER — GABAPENTIN 600 MG PO TABS: ORAL_TABLET | ORAL | 3 refills | Status: DC

## 2021-06-10 MED ORDER — BUPROPION HCL ER (XL) 300 MG PO TB24: 300.0000 mg | ORAL_TABLET | Freq: Every day | ORAL | 2 refills | Status: DC

## 2021-06-10 MED ORDER — BACLOFEN 10 MG PO TABS: ORAL_TABLET | ORAL | 3 refills | Status: DC

## 2021-06-10 NOTE — Telephone Encounter (Signed)
E-scribed rx's

## 2021-06-10 NOTE — Telephone Encounter (Signed)
Pt called and stated that she is having a difficult time with [facility] and is no lon ger wanting her medications sent there. Pt would like all of her medications sent to the [facility] on [address]

## 2021-06-15 ENCOUNTER — Ambulatory Visit: Payer: Medicare PPO | Admitting: Neurology

## 2021-07-06 ENCOUNTER — Other Ambulatory Visit: Payer: Self-pay | Admitting: Neurology

## 2021-10-26 ENCOUNTER — Other Ambulatory Visit: Payer: Self-pay | Admitting: Neurology

## 2021-10-27 NOTE — Telephone Encounter (Signed)
Received refill request for xanax.  Last OV was on 05/18/21.  Next OV is scheduled for 11/16/21 .  Last RX was written on 06/27/21 for 30 tabs.   [place] [facility] Database has been reviewed.

## 2021-11-02 ENCOUNTER — Telehealth: Payer: Self-pay | Admitting: Neurology

## 2021-11-02 NOTE — Telephone Encounter (Signed)
Pt has called to report an exacerbation since Thursday of last week.  Pt states she thought if she went to bed that would help, she has felt this way through the weekend.  Pt states she did not go to the ED ?

## 2021-11-02 NOTE — Telephone Encounter (Signed)
Called pt back. Sx started last Thursday. Having electrical shocks in both legs and pain in eyes. Intermittent blurry vision when reading. She is off of Rebif. Made earlier f/u for 11/04/21 at 47a w/ Dr. Felecia Shelling. Cx appt for 03/20 since she is coming in sooner. Aware I will speak w/ MD to see if he recommends anything prior to appt and will call back.  ?

## 2021-11-02 NOTE — Telephone Encounter (Signed)
LVM for pt letting her know per MD that he will see her at appt. No recommendations prior to appt. Asked she check in by 9am. ?

## 2021-11-04 ENCOUNTER — Other Ambulatory Visit: Payer: Self-pay

## 2021-11-04 ENCOUNTER — Ambulatory Visit: Payer: Medicare PPO | Admitting: Neurology

## 2021-11-04 ENCOUNTER — Encounter: Payer: Self-pay | Admitting: Neurology

## 2021-11-04 VITALS — BP 126/82 | HR 84 | Ht 66.0 in | Wt 140.0 lb

## 2021-11-04 DIAGNOSIS — R52 Pain, unspecified: Secondary | ICD-10-CM

## 2021-11-04 DIAGNOSIS — R5383 Other fatigue: Secondary | ICD-10-CM | POA: Diagnosis not present

## 2021-11-04 DIAGNOSIS — G5702 Lesion of sciatic nerve, left lower limb: Secondary | ICD-10-CM | POA: Diagnosis not present

## 2021-11-04 DIAGNOSIS — G35 Multiple sclerosis: Secondary | ICD-10-CM

## 2021-11-04 DIAGNOSIS — G4700 Insomnia, unspecified: Secondary | ICD-10-CM

## 2021-11-04 MED ORDER — CARISOPRODOL 350 MG PO TABS: 350.0000 mg | ORAL_TABLET | Freq: Three times a day (TID) | ORAL | 5 refills | Status: DC | PRN

## 2021-11-04 MED ORDER — IMIPRAMINE HCL 25 MG PO TABS: 25.0000 mg | ORAL_TABLET | Freq: Every day | ORAL | 5 refills | Status: DC

## 2021-11-04 NOTE — Progress Notes (Signed)
[facility]  PATIENT: Dorothy Meyer DOB: [birth year]  REFERRING DOCTOR OR PCP:  Scarlette Slice  SOURCE: Patient  _________________________________   HISTORICAL  CHIEF COMPLAINT:  Chief Complaint  Patient presents with   Follow-up    Pt alone, rm 3 (NCV), she is having constant pain in her sacral area. Makes it difficult to sit for long periods of time. Difficulty with sleep because of this. She has been off MS meds for > year and she has noticed steady decline. Had c/o  of Blurry vision    HISTORY OF PRESENT ILLNESS:  Dorothy Meyer is a 71 y.o. woman with MS.    She was diagnosed with relapsing remitting MS in 1996.    Update 11/04/2021: She is noting a lot more pain in the left buttock region  Pain intensifies with siting.    She is sleeping poorly due to pain     Laying on the left side intensifies the pain.    PT was tried but intensified the pain.    She had a shot in the buttock - was more lateral than I usually do  piriformis muscle so I'm not sure if piriformis or other target.    She takes soma with some benefit    Xray showed calcifications in the glut's, likely granulomas (from Rebif shots?).     She has noted mild visual changes OS with a pink blurring over her vision.   This fluctuates.      She stopped the Rebif in 2022.  She does not appear to be having exacerbation..    She has some difficulty with gait.  She has a mild right foot drop and more spasticity in that leg than the left.  Some proximal right leg weakness as well.  This is stable since the last visit...       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   This was treated with gamma knife.   She has a mild hearing loss as sequela.      MS History:    She was diagnosed with multiple sclerosis in the 1997 after she presented with numbness in both arms. An MRI of the cervical spine showed a plaque at C2C3.   An MRI of the brain at that time showed other lesions, compatible with the  diagnosis of multiple sclerosis. She had only 3 plaques in the brain initially and treatment was deferred. However, one year later she had optic neuritis, confirmed the diagnosis of multiple sclerosis. Initially, she was placed on Avonex. She then tried Copaxonelater on as she was having flulike symptoms with Avonex. However, she had severe skin reactions with Copaxone that was stopped. She then went on Rebif. She has been on Rebif for the past 8 years or so and tolerates it very well. Her MS has been mostly stable while on the Rebif. Last exacerbation was 2015.   Her Rebif dose is 22 g. She tolerates that dose well.    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  She sees at [facility] for this. Her last MRI 10/2015 was stable.   She has had gamma knife.   Imaging: MRI of the brain in 2015 did not show any new lesions.  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 On the cervical spine MRI she has 3 hyperintense foci consistent with demyelinating  plaque.  MRI brain 07/14/2015 showed similar appearance of 10 x 7 mm enhancing extra-axial mass along the left tentorium and projecting infratentorially compared to the scan from 07/16/2013. No new enhancing masses are seen. No significant white matter disease.    11/05/2016 cervical spine MRI shows two T2 hyperintense foci within the spinal cord, anteriorly just below the cervicomedullary junction and posteriorly adjacent to C2-C3.   Neither of these appear to be acute or enhance after contrast is administered.    Moderate degenerative changes causing mild spinal stenosis at C3-C4, C4-C5, C5-C6 and C6-C7. There does not appear to be any nerve root compression.  There is a normal enhancement pattern and there are no acute findings.  11/05/2016 MRI of the thoracic spine showed a normal spinal cord and some mild degenerative changes without  nerve root compression or spinal stenosis.  MRI brain 08/22/2020 showed scattered T2/FLAIR hyperintense foci in the hemispheres and 2 FLAIR hyperintense foci in the spinal cord.  None of these enhance or appear to be acute.  They were present on previous MRIs from 2016 and 2018.  Although the brain foci are very nonspecific and could represent chronic   microvascular ischemic change or demyelination, the 2 lesions in the upper cervical spine are consistent with the clinical diagnosis of multiple sclerosis. Also,    10 x 8 mm meningioma at the left cerebellopontine angle unchanged in appearance compared to the 07/14/2015 MRI  REVIEW OF SYSTEMS: Constitutional: No fevers, chills, sweats, or change in appetite.   She has fatigue and insomnia.   Eyes: No visual changes, double vision, eye pain Ear, nose and throat: No hearing loss, ear pain, nasal congestion, sore throat Cardiovascular: No chest pain, palpitations Respiratory:  No shortness of breath at rest or with exertion.   No wheezes GastrointestinaI: No nausea, vomiting, diarrhea, abdominal pain, fecal incontinence Genitourinary:  No dysuria, urinary retention or frequency.  No nocturia. Musculoskeletal:  No neck pain, back pain Integumentary: No rash, pruritus, skin lesions Neurological: as above Psychiatric: No current depression at this time.  No anxiety Endocrine: No palpitations, diaphoresis, change in appetite, change in weigh or increased thirst Hematologic/Lymphatic:  No anemia, purpura, petechiae. Allergic/Immunologic: No itchy/runny eyes, nasal congestion, recent allergic reactions, rashes  ALLERGIES: Allergies  Allergen Reactions   Doxycycline Other (See Comments) and Rash    headaches headaches    Hydrocodone-Acetaminophen Nausea And Vomiting   Oxycodone Nausea And Vomiting   Propoxyphene Nausea And Vomiting   Tramadol Nausea And Vomiting   Lamotrigine Rash    HOME MEDICATIONS:  Current Outpatient Medications:     ALPRAZolam (XANAX) 0.5 MG tablet, TAKE 1 TABLET BY MOUTH AT BEDTIME AS NEEDED FOR ANXIETY, Disp: 30 tablet, Rfl: 0   ARNUITY ELLIPTA 100 MCG/ACT AEPB, Inhale 100 mcg into the lungs., Disp: , Rfl:    baclofen (LIORESAL) 10 MG tablet, TAKE 1 TABLET FOUR TIMES DAILY, Disp: 360 tablet, Rfl: 3   buPROPion (WELLBUTRIN XL) 300 MG 24 hr tablet, Take 1 tablet (300 mg total) by mouth daily., Disp: 90 tablet, Rfl: 2   gabapentin (NEURONTIN) 600 MG tablet, TAKE 1 TABLET FOUR TIMES DAILY AS NEEDED, Disp: 360 tablet, Rfl: 3   imipramine (TOFRANIL) 25 MG tablet, Take 1 tablet (25 mg total) by mouth at bedtime., Disp: 30 tablet, Rfl: 5   lisinopril-hydrochlorothiazide (ZESTORETIC) 10-12.5 MG tablet, Take 1 tablet by mouth daily., Disp: , Rfl:    metroNIDAZOLE (METROGEL) 1 % gel, Apply 1 application topically daily. , Disp: , Rfl:  QVAR REDIHALER 80 MCG/ACT inhaler, , Disp: , Rfl:    carisoprodol (SOMA) 350 MG tablet, Take 1 tablet (350 mg total) by mouth 3 (three) times daily as needed. for muscle spams, Disp: 90 tablet, Rfl: 5   diltiazem (CARDIZEM CD) 120 MG 24 hr capsule, Take by mouth., Disp: , Rfl:   PAST MEDICAL HISTORY: Past Medical History:  Diagnosis Date   Benign brain tumor ([employer])    Chronic sinusitis    Hearing loss    Hypertension    Multiple sclerosis ([place])    Raynaud disease    Vision abnormalities     PAST SURGICAL HISTORY: Past Surgical History:  Procedure Laterality Date   BRAIN TUMOR EXCISION     hystrectomy     NASAL SINUS SURGERY      FAMILY HISTORY: Family History  Problem Relation Age of Onset   Healthy Mother    Lung cancer Father     SOCIAL HISTORY:  Social History   Socioeconomic History   Marital status: Married    Spouse name: Not on file   Number of children: Not on file   Years of education: Not on file   Highest education level: Not on file  Occupational History   Not on file  Tobacco Use   Smoking status: Never   Smokeless tobacco: Never   Substance and Sexual Activity   Alcohol use: No    Alcohol/week: 0.0 standard drinks   Drug use: No   Sexual activity: Not on file  Other Topics Concern   Not on file  Social History Narrative   Not on file   Social Determinants of Health   Financial Resource Strain: Not on file  Food Insecurity: Not on file  Transportation Needs: Not on file  Physical Activity: Not on file  Stress: Not on file  Social Connections: Not on file  Intimate Partner Violence: Not on file     PHYSICAL EXAM  Vitals:   11/04/21 0921  BP: 126/82  Pulse: 84  Weight: 140 lb (63.5 kg)  Height: '5\' 6"'$  (1.676 m)    Body mass index is 22.6 kg/m.   General: The patient is well-developed and well-nourished and in no acute distress  Skin/Ext: Extremities are without rash or edema.   She is moderately tender over the left piriformis muscle and mildly tender over the right.  No tenderness over the lumbar paraspinal muscles or the trochanteric bursae.Marland Kitchen    Neurologic Exam  Mental status: The patient is alert and oriented x 3 at the time of the examination. The patient has apparent normal recent and remote memory, with an apparently normal attention span and concentration ability.   Speech is normal.  Cranial nerves: Extraocular movements are full.  Facial strength and sensation was normal.  Trapezius strength was normal..   Hearing appears to be normal.  Motor: Muscle bulk, tone and strength are normal.  Sensory: She had intact sensation to touch and vibration in the arms and legs.  Gait and station: Station is normal.   The gait is mildly wide with a slight right foot drop.  Tandem gait is mildly wide.  Deep tendon reflexes: reflexes were 2 and symmetric in the arms, 3 at the knees and 2 at the ankles.               ASSESSMENT AND PLAN  Multiple sclerosis (HCC)  Piriformis syndrome, left  Pain aggravated by sitting  Other fatigue  Insomnia, unspecified type   1.  She will remain off  Rebif.  She will have periodic MRis to assess stability. 2.   Continue baclofen and Soma for spasticity and gabapentin for dysesthesias.   Take a full soma qHS to help spasm pain/sleep at night.   If no improvement with insomnia., add a tricyclic     3.   Stay active and exercise as tolerated.   4.  Piriformis muscle trigger point injections with 80 mg Depo-Medrol and 6 cc Marcaine split into each side using sterile technique.   She tolerated the injections well.   .   5.  She will return to see me in 6 months or sooner if she has new or worsening neurologic sympto  Tarl Cephas A. Felecia Shelling, MD, PhD 10/08/5282, 13:24 AM Certified in Neurology, Clinical Neurophysiology, Sleep Medicine, Pain Medicine and Neuroimaging  [facility] [address], [place], [place] [address] [phone]

## 2021-11-16 ENCOUNTER — Ambulatory Visit: Payer: Medicare PPO | Admitting: Neurology

## 2022-01-22 ENCOUNTER — Telehealth: Payer: Self-pay | Admitting: Neurology

## 2022-01-22 NOTE — Telephone Encounter (Signed)
PA submitted through [employer] KEY:[id number] Will await determination

## 2022-01-26 NOTE — Telephone Encounter (Signed)
PA Case: [id number], Status: Approved, Coverage Starts on: 08/30/2021 12:00:00 AM, Coverage Ends on: 08/29/2022 12:00:00 AM. Questions? Contact [phone].

## 2022-02-19 ENCOUNTER — Other Ambulatory Visit: Payer: Self-pay | Admitting: Neurology

## 2022-05-13 ENCOUNTER — Other Ambulatory Visit: Payer: Self-pay | Admitting: Dentistry

## 2022-05-13 DIAGNOSIS — M26632 Articular disc disorder of left temporomandibular joint: Secondary | ICD-10-CM

## 2022-06-01 ENCOUNTER — Other Ambulatory Visit: Payer: Medicare PPO

## 2022-06-09 ENCOUNTER — Ambulatory Visit
Admission: RE | Admit: 2022-06-09 | Discharge: 2022-06-09 | Disposition: A | Discharge status: Home / Self Care | Payer: Medicare PPO | Source: Ambulatory Visit | Attending: Dentistry | Admitting: Dentistry

## 2022-06-09 DIAGNOSIS — M26632 Articular disc disorder of left temporomandibular joint: Secondary | ICD-10-CM

## 2022-06-11 ENCOUNTER — Other Ambulatory Visit: Payer: Medicare PPO

## 2022-07-13 ENCOUNTER — Other Ambulatory Visit: Payer: Self-pay | Admitting: Neurology

## 2022-09-11 ENCOUNTER — Other Ambulatory Visit: Payer: Self-pay | Admitting: Neurology

## 2022-11-08 ENCOUNTER — Ambulatory Visit: Payer: Medicare PPO | Admitting: Neurology

## 2022-11-08 ENCOUNTER — Encounter: Payer: Self-pay | Admitting: Neurology

## 2022-11-08 VITALS — BP 124/72 | HR 71 | Ht 66.0 in | Wt 144.0 lb

## 2022-11-08 DIAGNOSIS — G35 Multiple sclerosis: Secondary | ICD-10-CM | POA: Diagnosis not present

## 2022-11-08 DIAGNOSIS — M21371 Foot drop, right foot: Secondary | ICD-10-CM | POA: Diagnosis not present

## 2022-11-08 DIAGNOSIS — R208 Other disturbances of skin sensation: Secondary | ICD-10-CM

## 2022-11-08 DIAGNOSIS — D329 Benign neoplasm of meninges, unspecified: Secondary | ICD-10-CM | POA: Diagnosis not present

## 2022-11-08 DIAGNOSIS — S0300XS Dislocation of jaw, unspecified side, sequela: Secondary | ICD-10-CM

## 2022-11-08 NOTE — Progress Notes (Signed)
[facility]  PATIENT: Dorothy Meyer DOB: May 06, 1951  REFERRING DOCTOR OR PCP:  Scarlette Slice  SOURCE: Patient  _________________________________   HISTORICAL  CHIEF COMPLAINT:  Chief Complaint  Patient presents with   Follow-up    Pt in room 10 here for MS follow up. Pt reports MS is going well.     HISTORY OF PRESENT ILLNESS:  Dorothy Meyer is a 72 y.o. woman with MS.    She was diagnosed with relapsing remitting MS in 1996.    Update 11/08/2022: She is noting a lot more pain in the left buttock region  Pain intensifies with siting.    She is sleeping poorly due to pain     Laying on the left side intensifies the pain.    She stopped the Rebif in 2022.  She does not appear to be having exacerbation..    Had TMJ MRi but brain not well covere.   Last MRi for brain was 2021 and showed no new lesions.     Buttock/piriformis pain is much better now.   Water Aerobics helped.  Xray showed calcifications in the glut's, likely granulomas (from Rebif shots?).     TMJ is painful for her.   She stopped carisoprodol.  She takes valium '2mg'$  tid for TMJ.   She has had a lot of sinus infections    She has noted mild visual changes OS with a pink blurring over her vision.   This fluctuates.       She has some difficulty with gait.  She has a mild right foot drop and more spasticity in that leg than the left.  Some proximal right leg weakness as well.  Leg spasticity is helped by baclofen 20 mg po bis.   This is stable since the last visit...   She uses the bannister going downstairs.   No numbness and tingling is heled by gabapenin (takes bid).  Vision is fine.  Bladder function is fine.      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   This was treated with gamma knife in 2013 (Dr. Salomon Fick).   She has a mild hearing loss as sequela.      MS History:    She was diagnosed with multiple sclerosis in the 1997 after she presented with numbness in both arms. An MRI of the cervical  spine showed a plaque at C2C3.   An MRI of the brain at that time showed other lesions, compatible with the diagnosis of multiple sclerosis. She had only 3 plaques in the brain initially and treatment was deferred. However, one year later she had optic neuritis, confirmed the diagnosis of multiple sclerosis. Initially, she was placed on Avonex. She then tried Copaxonelater on as she was having flulike symptoms with Avonex. However, she had severe skin reactions with Copaxone that was stopped. She then went on Rebif. She has been on Rebif for the past 8 years or so and tolerates it very well. Her MS has been mostly stable while on the Rebif. Last exacerbation was 2015.   Her Rebif dose is 22 g. She tolerates that dose well.    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  She sees at [facility] for this. Her last MRI 10/2015 was stable.   She has had gamma knife.   Imaging: MRI of the brain in 2015 did not show any new lesions.  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 On the cervical spine MRI she has 3 hyperintense foci consistent with demyelinating plaque.  MRI brain 07/14/2015 showed similar appearance of 10 x 7 mm enhancing extra-axial mass along the left tentorium and projecting infratentorially compared to the scan from 07/16/2013. No new enhancing masses are seen. No significant white matter disease.    11/05/2016 cervical spine MRI shows two T2 hyperintense foci within the spinal cord, anteriorly just below the cervicomedullary junction and posteriorly adjacent to C2-C3.   Neither of these appear to be acute or enhance after contrast is administered.    Moderate degenerative changes causing mild spinal stenosis at C3-C4, C4-C5, C5-C6 and C6-C7. There does not appear to be any nerve root compression.  There is a normal enhancement pattern and there are no acute  findings.  11/05/2016 MRI of the thoracic spine showed a normal spinal cord and some mild degenerative changes without nerve root compression or spinal stenosis.  MRI brain 08/22/2020 showed scattered T2/FLAIR hyperintense foci in the hemispheres and 2 FLAIR hyperintense foci in the spinal cord.  None of these enhance or appear to be acute.  They were present on previous MRIs from 2016 and 2018.  Although the brain foci are very nonspecific and could represent chronic   microvascular ischemic change or demyelination, the 2 lesions in the upper cervical spine are consistent with the clinical diagnosis of multiple sclerosis. Also,    10 x 8 mm meningioma at the left cerebellopontine angle unchanged in appearance compared to the 07/14/2015 MRI  REVIEW OF SYSTEMS: Constitutional: No fevers, chills, sweats, or change in appetite.   She has fatigue and insomnia.   Eyes: No visual changes, double vision, eye pain Ear, nose and throat: No hearing loss, ear pain, nasal congestion, sore throat Cardiovascular: No chest pain, palpitations Respiratory:  No shortness of breath at rest or with exertion.   No wheezes GastrointestinaI: No nausea, vomiting, diarrhea, abdominal pain, fecal incontinence Genitourinary:  No dysuria, urinary retention or frequency.  No nocturia. Musculoskeletal:  No neck pain, back pain Integumentary: No rash, pruritus, skin lesions Neurological: as above Psychiatric: No current depression at this time.  No anxiety Endocrine: No palpitations, diaphoresis, change in appetite, change in weigh or increased thirst Hematologic/Lymphatic:  No anemia, purpura, petechiae. Allergic/Immunologic: No itchy/runny eyes, nasal congestion, recent allergic reactions, rashes  ALLERGIES: Allergies  Allergen Reactions   Doxycycline Other (See Comments) and Rash    headaches headaches    Hydrocodone-Acetaminophen Nausea And Vomiting   Oxycodone Nausea And Vomiting   Propoxyphene Nausea And  Vomiting   Tramadol Nausea And Vomiting   Lamotrigine Rash    HOME MEDICATIONS:  Current Outpatient Medications:    ARNUITY ELLIPTA 100 MCG/ACT AEPB, Inhale 100 mcg into the lungs., Disp: , Rfl:    baclofen (LIORESAL) 10 MG tablet, TAKE 1 TABLET FOUR TIMES DAILY, Disp: 360 tablet, Rfl: 3   buPROPion (WELLBUTRIN XL) 300 MG 24 hr tablet, TAKE 1 TABLET EVERY DAY, Disp: 90 tablet, Rfl: 2   carisoprodol (SOMA) 350 MG tablet, Take 1 tablet by mouth three times daily as needed for muscle spasm, Disp: 90 tablet, Rfl: 0   gabapentin (NEURONTIN) 600 MG tablet, TAKE 1 TABLET FOUR TIMES DAILY AS NEEDED, Disp: 360 tablet, Rfl: 3   lisinopril-hydrochlorothiazide (ZESTORETIC) 10-12.5 MG tablet, Take 1 tablet by mouth daily., Disp: , Rfl:    metroNIDAZOLE (METROGEL) 1 % gel, Apply 1 application topically  daily. , Disp: , Rfl:    QVAR REDIHALER 80 MCG/ACT inhaler, , Disp: , Rfl:    diltiazem (CARDIZEM CD) 120 MG 24 hr capsule, Take by mouth., Disp: , Rfl:   PAST MEDICAL HISTORY: Past Medical History:  Diagnosis Date   Benign brain tumor ([place])    Chronic sinusitis    Hearing loss    Hypertension    Multiple sclerosis ([place])    Raynaud disease    Vision abnormalities     PAST SURGICAL HISTORY: Past Surgical History:  Procedure Laterality Date   BRAIN TUMOR EXCISION     hystrectomy     NASAL SINUS SURGERY      FAMILY HISTORY: Family History  Problem Relation Age of Onset   Healthy Mother    Lung cancer Father     SOCIAL HISTORY:  Social History   Socioeconomic History   Marital status: Married    Spouse name: Not on file   Number of children: Not on file   Years of education: Not on file   Highest education level: Not on file  Occupational History   Not on file  Tobacco Use   Smoking status: Never   Smokeless tobacco: Never  Substance and Sexual Activity   Alcohol use: No    Alcohol/week: 0.0 standard drinks of alcohol   Drug use: No   Sexual activity: Not on file  Other  Topics Concern   Not on file  Social History Narrative   Not on file   Social Determinants of Health   Financial Resource Strain: Not on file  Food Insecurity: Not on file  Transportation Needs: Not on file  Physical Activity: Not on file  Stress: Not on file  Social Connections: Not on file  Intimate Partner Violence: Not on file     PHYSICAL EXAM  Vitals:   11/08/22 0947  BP: 124/72  Pulse: 71  Weight: 144 lb (65.3 kg)  Height: '5\' 6"'$  (1.676 m)    Body mass index is 23.24 kg/m.   General: The patient is well-developed and well-nourished and in no acute distress  Skin/Ext: Extremities are without rash or edema.   She is moderately tender over the left piriformis muscle and mildly tender over the right.  No tenderness over the lumbar paraspinal muscles or the trochanteric bursae.Marland Kitchen    Neurologic Exam  Mental status: The patient is alert and oriented x 3 at the time of the examination. The patient has apparent normal recent and remote memory, with an apparently normal attention span and concentration ability.   Speech is normal.  Cranial nerves: Extraocular movements are full.  Facial strength and sensation was normal.  Trapezius strength was normal..   Hearing appears to be normal.  Motor: Muscle bulk, tone and strength are normal.  Sensory: She had intact sensation to touch and vibration in the arms and legs.  Gait and station: Station is normal.   The gait is mildly wide with a mild right foot drop.  Her tandem gait is mildly wide.  Romberg is negative   Deep tendon reflexes: reflexes were 2 and symmetric in the arms, 3 at the knees and 2 at the ankles.               ASSESSMENT AND PLAN  Multiple sclerosis ([place]) - Plan: MR BRAIN W WO CONTRAST  Meningioma ([place]) - Plan: MR BRAIN W WO CONTRAST  Dysesthesia  Right foot drop  Dislocation of temporomandibular joint, sequela   1.  Remain off Rebif.  She will have a brain MRI to assess progression.  If  occurring, consider a  DMT.    Also need the MRi for the meningioma follow up (last one in 2021) 2.   Continue baclofen for spasticity and gabapentin for dysesthesias.  Also on low dose valiumfor TMJ 3.   Stay active and exercise as tolerated.   4.   She will return to see me in 6 months or sooner if she has new or worsening neurologic sympto  Fiza Nation A. Felecia Shelling, MD, PhD 0000000, 0000000 AM Certified in Neurology, Clinical Neurophysiology, Sleep Medicine, Pain Medicine and Neuroimaging  [facility] [address] [place], [place] [address] [phone]

## 2022-11-09 ENCOUNTER — Telehealth: Payer: Self-pay | Admitting: *Deleted

## 2022-11-09 NOTE — Telephone Encounter (Addendum)
Gave completed/signed form below back to medical records to process for pt.

## 2022-11-10 ENCOUNTER — Telehealth: Payer: Self-pay | Admitting: *Deleted

## 2022-11-10 NOTE — Telephone Encounter (Signed)
Pt parking placard @ front desk for p/u

## 2022-11-15 ENCOUNTER — Telehealth: Payer: Self-pay | Admitting: Neurology

## 2022-11-15 NOTE — Telephone Encounter (Signed)
Mcarthur Rossetti Josem Kaufmann: [id number] exp. 11/15/22-12/15/22 sent to GI [phone]

## 2022-12-09 ENCOUNTER — Ambulatory Visit
Admission: RE | Admit: 2022-12-09 | Discharge: 2022-12-09 | Disposition: A | Discharge status: Home / Self Care | Payer: Medicare PPO | Source: Ambulatory Visit | Attending: Neurology | Admitting: Neurology

## 2022-12-09 DIAGNOSIS — D329 Benign neoplasm of meninges, unspecified: Secondary | ICD-10-CM

## 2022-12-09 DIAGNOSIS — G35 Multiple sclerosis: Secondary | ICD-10-CM

## 2022-12-09 MED ORDER — GADOPICLENOL 0.5 MMOL/ML IV SOLN
6.0000 mL | Freq: Once | INTRAVENOUS | Status: AC | PRN
  Administered 2022-12-09: 6 mL via INTRAVENOUS

## 2022-12-11 ENCOUNTER — Other Ambulatory Visit: Payer: Self-pay | Admitting: Neurology

## 2022-12-14 NOTE — Telephone Encounter (Signed)
Last seen on 11/08/22 Follow up scheduled on 05/23/23 Last filled on 09/29/22 # 90 tablets (90 day supply)

## 2023-02-08 ENCOUNTER — Other Ambulatory Visit: Payer: Self-pay | Admitting: Neurology

## 2023-02-08 NOTE — Telephone Encounter (Addendum)
Last seen on 11/08/22 Follow up scheduled on 05/23/23  Dr.Sater do you want patient to continue bupropion 300 mg tablet? I didn't see it mentioned in recent note?   Rx is pending.

## 2023-03-24 ENCOUNTER — Other Ambulatory Visit: Payer: Self-pay | Admitting: *Deleted

## 2023-03-24 MED ORDER — BACLOFEN 10 MG PO TABS: ORAL_TABLET | ORAL | 0 refills | Status: DC

## 2023-03-24 NOTE — Telephone Encounter (Signed)
Last seen on 11/08/22 Follow up scheduled on 05/23/23

## 2023-05-23 ENCOUNTER — Encounter: Payer: Self-pay | Admitting: Neurology

## 2023-05-23 ENCOUNTER — Ambulatory Visit: Payer: Medicare PPO | Admitting: Neurology

## 2023-05-24 ENCOUNTER — Telehealth: Payer: Self-pay | Admitting: Neurology

## 2023-05-24 NOTE — Telephone Encounter (Signed)
Pt reschedule appt  from no show on 05/23/23

## 2023-06-02 ENCOUNTER — Encounter: Payer: Self-pay | Admitting: Neurology

## 2023-06-02 ENCOUNTER — Telehealth: Payer: Self-pay | Admitting: Neurology

## 2023-06-02 ENCOUNTER — Ambulatory Visit: Payer: Medicare PPO | Admitting: Neurology

## 2023-06-02 VITALS — BP 124/77 | HR 66 | Ht 66.0 in | Wt 143.0 lb

## 2023-06-02 DIAGNOSIS — M21371 Foot drop, right foot: Secondary | ICD-10-CM

## 2023-06-02 DIAGNOSIS — G5702 Lesion of sciatic nerve, left lower limb: Secondary | ICD-10-CM | POA: Diagnosis not present

## 2023-06-02 DIAGNOSIS — D329 Benign neoplasm of meninges, unspecified: Secondary | ICD-10-CM | POA: Diagnosis not present

## 2023-06-02 DIAGNOSIS — R208 Other disturbances of skin sensation: Secondary | ICD-10-CM | POA: Diagnosis not present

## 2023-06-02 DIAGNOSIS — G35 Multiple sclerosis: Secondary | ICD-10-CM

## 2023-06-02 MED ORDER — BACLOFEN 10 MG PO TABS: ORAL_TABLET | ORAL | 0 refills | Status: DC

## 2023-06-02 MED ORDER — CARISOPRODOL 350 MG PO TABS: 350.0000 mg | ORAL_TABLET | Freq: Three times a day (TID) | ORAL | 0 refills | Status: DC | PRN

## 2023-06-02 MED ORDER — GABAPENTIN 600 MG PO TABS: ORAL_TABLET | ORAL | 3 refills | Status: DC

## 2023-06-02 NOTE — Telephone Encounter (Signed)
Called pt back. Had 23month check up this past Monday but missed it. R/s appt to March 2025.   I offered sooner appt today at 4pm with Dr. Epimenio Foot. She accepted. I scheduled and asked her to check in at 3:30pm. She will discuss her meds at appt with MD.

## 2023-06-02 NOTE — Progress Notes (Signed)
[facility]  PATIENT: Dorothy Meyer DOB: Nov 03, 1950  REFERRING DOCTOR OR PCP:  Jessee Avers  SOURCE: Patient  _________________________________   HISTORICAL  CHIEF COMPLAINT:  Chief Complaint  Patient presents with   Room 11    Pt is here Alone. Pt states that things have been going fine with her MS since her last appointment. Pt states that she doesn't have any new symptoms or concerns to discuss today.     HISTORY OF PRESENT ILLNESS:  Dorothy Meyer is a 72 y.o. woman with MS.    She was diagnosed with relapsing remitting MS in 1996.    Update 06/02/2023: She stopped the Rebif in 2022.  She does not appear to be having exacerbation..    Had TMJ MRi but brain not well covere.   Last MRi for brain was 2021 and showed no new lesions.     Buttock/piriformis pain is sometimes a problem -- a few nights a week this bothers her.   Water Aerobics helped.  Xray showed calcifications in the glut's, likely granulomas (from Rebif shots?).   She takes gabapentin 600 mg po 2 to 3 times a day hels her sciatic region pain.  She is also on baclofen and it helps her pain     She has some difficulty with gait.  She has a mild right foot drop and more spasticity in that leg than the left.  She takes baclofen 20 mg twice daily for the spasticity.    She uses the bannister going downstairs.   No numbness.  The dysesthesias are improved with gabapenin (takes bid).  Vision is fine.  Bladder function is fine.      She has sleep maintenance insomnia.  No trouble with sleep .  She does not have nocturia, RLS or snoring.   She notes brain fog some days.   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   This was treated with gamma knife in 2013 (Dr. Angelyn Punt).   She has a mild hearing loss as sequela.      MS History:    She was diagnosed with multiple sclerosis in the 1997 after she presented with numbness in both arms. An MRI of  the cervical spine showed a plaque at C2C3.   An MRI of the brain at that time showed other lesions, compatible with the diagnosis of multiple sclerosis. She had only 3 plaques in the brain initially and treatment was deferred. However, one year later she had optic neuritis, confirmed the diagnosis of multiple sclerosis. Initially, she was placed on Avonex. She then tried Copaxonelater on as she was having flulike symptoms with Avonex. However, she had severe skin reactions with Copaxone that was stopped. She then went on Rebif. She has been on Rebif for the past 8 years or so and tolerates it very well. Her MS has been mostly stable while on the Rebif. Last exacerbation was 2015.   Her Rebif dose is 22 g. She tolerates that dose well.    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  She sees at [facility] for this. Her last MRI 10/2015 was stable.   She has had gamma knife.   Imaging: MRI of the brain in 2015 did not show any new lesions.  She also has a left tentorial-based meningioma that has been  stable in size times many years. On the cervical spine MRI she has 3 hyperintense foci consistent with demyelinating plaque.  MRI brain 07/14/2015 showed similar appearance of 10 x 7 mm enhancing extra-axial mass along the left tentorium and projecting infratentorially compared to the scan from 07/16/2013. No new enhancing masses are seen. No significant white matter disease.    11/05/2016 cervical spine MRI shows two T2 hyperintense foci within the spinal cord, anteriorly just below the cervicomedullary junction and posteriorly adjacent to C2-C3.   Neither of these appear to be acute or enhance after contrast is administered.    Moderate degenerative changes causing mild spinal stenosis at C3-C4, C4-C5, C5-C6 and C6-C7. There does not appear to be any nerve root compression.  There is a normal enhancement pattern and there are no  acute findings.  11/05/2016 MRI of the thoracic spine showed a normal spinal cord and some mild degenerative changes without nerve root compression or spinal stenosis.  MRI brain 08/22/2020 showed scattered T2/FLAIR hyperintense foci in the hemispheres and 2 FLAIR hyperintense foci in the spinal cord.  None of these enhance or appear to be acute.  They were present on previous MRIs from 2016 and 2018.  Although the brain foci are very nonspecific and could represent chronic   microvascular ischemic change or demyelination, the 2 lesions in the upper cervical spine are consistent with the clinical diagnosis of multiple sclerosis. Also,    10 x 8 mm meningioma at the left cerebellopontine angle unchanged in appearance compared to the 07/14/2015 MRI  MRI  brain 12/09/2022 showed T2 hyperintense foci within the spinal cord, right cerebral peduncle and cerebral hemispheres.  Although some of the foci are nonspecific, the overall pattern is consistent with chronic demyelination associated with multiple sclerosis.  There could be minimal superimposed chronic microvascular ischemic changes.  None of the foci appear to be acute.  They do not enhance.  Compared to the MRI from 2021, there are no new lesions.   10 x 7 mm enhancing dural based left tentorial focus consistent with a meningioma, unchanged compared to the 2021 MR  REVIEW OF SYSTEMS: Constitutional: No fevers, chills, sweats, or change in appetite.   She has fatigue and insomnia.   Eyes: No visual changes, double vision, eye pain Ear, nose and throat: No hearing loss, ear pain, nasal congestion, sore throat Cardiovascular: No chest pain, palpitations Respiratory:  No shortness of breath at rest or with exertion.   No wheezes GastrointestinaI: No nausea, vomiting, diarrhea, abdominal pain, fecal incontinence Genitourinary:  No dysuria, urinary retention or frequency.  No nocturia. Musculoskeletal:  No neck pain, back pain Integumentary: No rash,  pruritus, skin lesions Neurological: as above Psychiatric: No current depression at this time.  No anxiety Endocrine: No palpitations, diaphoresis, change in appetite, change in weigh or increased thirst Hematologic/Lymphatic:  No anemia, purpura, petechiae. Allergic/Immunologic: No itchy/runny eyes, nasal congestion, recent allergic reactions, rashes  ALLERGIES: Allergies  Allergen Reactions   Doxycycline Other (See Comments) and Rash    headaches headaches    Hydrocodone-Acetaminophen Nausea And Vomiting   Oxycodone Nausea And Vomiting   Propoxyphene Nausea And Vomiting   Tramadol Nausea And Vomiting   Lamotrigine Rash    HOME MEDICATIONS:  Current Outpatient Medications:    ARNUITY ELLIPTA 100 MCG/ACT AEPB, Inhale 100 mcg into the lungs., Disp: , Rfl:    buPROPion (WELLBUTRIN XL) 300 MG 24 hr tablet, TAKE 1 TABLET EVERY DAY, Disp: 90 tablet, Rfl: 1   lisinopril-hydrochlorothiazide (ZESTORETIC)  10-12.5 MG tablet, Take 1 tablet by mouth daily., Disp: , Rfl:    metroNIDAZOLE (METROGEL) 1 % gel, Apply 1 application topically daily. , Disp: , Rfl:    QVAR REDIHALER 80 MCG/ACT inhaler, , Disp: , Rfl:    baclofen (LIORESAL) 10 MG tablet, TAKE 1 TABLET FOUR TIMES DAILY, Disp: 360 tablet, Rfl: 0   carisoprodol (SOMA) 350 MG tablet, Take 1 tablet (350 mg total) by mouth 3 (three) times daily as needed. for muscle spams, Disp: 90 tablet, Rfl: 0   diltiazem (CARDIZEM CD) 120 MG 24 hr capsule, Take by mouth., Disp: , Rfl:    gabapentin (NEURONTIN) 600 MG tablet, TAKE 1 TABLET FOUR TIMES DAILY AS NEEDED, Disp: 360 tablet, Rfl: 3  PAST MEDICAL HISTORY: Past Medical History:  Diagnosis Date   Benign brain tumor (HCC)    Chronic sinusitis    Hearing loss    Hypertension    Multiple sclerosis (HCC)    Raynaud disease    Vision abnormalities     PAST SURGICAL HISTORY: Past Surgical History:  Procedure Laterality Date   BRAIN TUMOR EXCISION     hystrectomy     NASAL SINUS SURGERY       FAMILY HISTORY: Family History  Problem Relation Age of Onset   Healthy Mother    Lung cancer Father     SOCIAL HISTORY:  Social History   Socioeconomic History   Marital status: Married    Spouse name: Not on file   Number of children: Not on file   Years of education: Not on file   Highest education level: Not on file  Occupational History   Not on file  Tobacco Use   Smoking status: Never   Smokeless tobacco: Never  Substance and Sexual Activity   Alcohol use: No    Alcohol/week: 0.0 standard drinks of alcohol   Drug use: No   Sexual activity: Not on file  Other Topics Concern   Not on file  Social History Narrative   Not on file   Social Determinants of Health   Financial Resource Strain: Low Risk  (05/12/2023)   Received from [facility]   Overall Financial Resource Strain (CARDIA)    Difficulty of Paying Living Expenses: Not hard at all  Food Insecurity: No Food Insecurity (05/12/2023)   Received from [facility]   Hunger Vital Sign    Worried About Running Out of Food in the Last Year: Never true    Ran Out of Food in the Last Year: Never true  Transportation Needs: No Transportation Needs (05/12/2023)   Received from [facility] - Transportation    Lack of Transportation (Medical): No    Lack of Transportation (Non-Medical): No  Physical Activity: Insufficiently Active (05/12/2023)   Received from [facility]   Exercise Vital Sign    Days of Exercise per Week: 2 days    Minutes of Exercise per Session: 30 min  Stress: No Stress Concern Present (05/12/2023)   Received from [facility] - Occupational Stress Questionnaire    Feeling of Stress : Not at all  Social Connections: Socially Integrated (05/12/2023)   Received from [facility]   Social Network    How would you rate your social network (family, work, friends)?: Good participation with social networks  Intimate Partner Violence:  Not At Risk (05/12/2023)   Received from [facility]   HITS    Over the last 12 months  how often did your partner physically hurt you?: 1    Over the last 12 months how often did your partner insult you or talk down to you?: 1    Over the last 12 months how often did your partner threaten you with physical harm?: 1    Over the last 12 months how often did your partner scream or curse at you?: 1     PHYSICAL EXAM  Vitals:   06/02/23 1547  BP: 124/77  Pulse: 66  Weight: 143 lb (64.9 kg)  Height: 5\' 6"  (1.676 m)    Body mass index is 23.08 kg/m.   General: The patient is well-developed and well-nourished and in no acute distress  Skin/Ext: Extremities are without rash or edema.   She is moderately tender over the left piriformis muscle and mildly tender over the right.  No tenderness over the lumbar paraspinal muscles or the trochanteric bursae.Marland Kitchen    Neurologic Exam  Mental status: The patient is alert and oriented x 3 at the time of the examination. The patient has apparent normal recent and remote memory, with an apparently normal attention span and concentration ability.   Speech is normal.  Cranial nerves: Extraocular movements are full.  Facial strength and sensation was normal.  Trapezius strength was normal..   Hearing appears to be normal.  Motor: Muscle bulk, tone and strength are normal.  Sensory: She had intact sensation to touch and vibration in the arms and legs.  Gait and station: Station is normal.  The gait is wide.  She has a right foot drop.  The tandem gait is poor.  Romberg is negative   Deep tendon reflexes: reflexes were 2 and symmetric in the arms, 3 at the knees and 2 at the ankles.               ASSESSMENT AND PLAN  Multiple sclerosis (HCC)  Meningioma (HCC)  Dysesthesia  Piriformis syndrome, left  Right foot drop   1.  She will remain off of a disease modifying therapy.  Will check periodic MRI to assess progression.  If occurring,  consider a  DMT.    Also need the MRi for the meningioma follow up (last one in 2021) 2.   Continue baclofen for spasticity and gabapentin for dysesthesias.    3.   Stay active and exercise as tolerated.   4.   She will return to see me in 6 months or sooner if she has new or worsening neurologic sympto  Carlis Blanchard A. Epimenio Foot, MD, PhD 06/02/2023, 4:30 PM Certified in Neurology, Clinical Neurophysiology, Sleep Medicine, Pain Medicine and Neuroimaging  [facility] [address], [address] [place], [place] [address] [phone]

## 2023-06-02 NOTE — Telephone Encounter (Signed)
Pt has questions she'd like to discuss with RN re: her carisoprodol (SOMA) 350 MG tablet & gabapentin (NEURONTIN) 600 MG tablet

## 2023-06-06 ENCOUNTER — Other Ambulatory Visit: Payer: Self-pay | Admitting: Neurology

## 2023-06-06 NOTE — Telephone Encounter (Signed)
Last seen on 06/02/23 Follow up scheduled on 11/24/22 Rx just filled on 06/02/23

## 2023-07-14 ENCOUNTER — Other Ambulatory Visit: Payer: Self-pay | Admitting: Neurology

## 2023-07-14 NOTE — Telephone Encounter (Signed)
Last seen on 06/02/23 Follow up scheduled on 11/24/23

## 2023-11-24 ENCOUNTER — Ambulatory Visit: Payer: Medicare PPO | Admitting: Neurology

## 2023-11-24 ENCOUNTER — Encounter: Payer: Self-pay | Admitting: Neurology

## 2023-11-24 VITALS — BP 96/67 | HR 83 | Ht 66.0 in | Wt 147.0 lb

## 2023-11-24 DIAGNOSIS — G35 Multiple sclerosis: Secondary | ICD-10-CM | POA: Diagnosis not present

## 2023-11-24 DIAGNOSIS — G5702 Lesion of sciatic nerve, left lower limb: Secondary | ICD-10-CM | POA: Diagnosis not present

## 2023-11-24 DIAGNOSIS — R208 Other disturbances of skin sensation: Secondary | ICD-10-CM

## 2023-11-24 DIAGNOSIS — D329 Benign neoplasm of meninges, unspecified: Secondary | ICD-10-CM

## 2023-11-24 DIAGNOSIS — G629 Polyneuropathy, unspecified: Secondary | ICD-10-CM

## 2023-11-24 DIAGNOSIS — M21371 Foot drop, right foot: Secondary | ICD-10-CM

## 2023-11-24 MED ORDER — CARISOPRODOL 350 MG PO TABS: 350.0000 mg | ORAL_TABLET | Freq: Three times a day (TID) | ORAL | 3 refills | Status: DC | PRN

## 2023-11-24 NOTE — Progress Notes (Signed)
 [facility]  PATIENT: Dorothy Meyer DOB: 04-25-1951  REFERRING DOCTOR OR PCP:  Jessee Avers  SOURCE: Patient  _________________________________   HISTORICAL  CHIEF COMPLAINT:  Chief Complaint  Patient presents with   Follow-up    Pt in room 10. Alone. Here for MS follow up.  Pt reports doing well, pt reports lower back pain when standing on feet.     HISTORY OF PRESENT ILLNESS:  Dorothy Meyer is a 73 y.o. woman with MS.    She was diagnosed with relapsing remitting MS in 1996.    Update 11/24/2023: She stopped the Rebif in 2022.  She does not appear to be having exacerbation..    Had TMJ MRi but brain not well covered.   Last MRi for brain was 2021 and showed no new lesions.     She continues to have a muscle stretching sensation in her back.    Buttock/piriformis pain is sometimes a problem -- a few nights a week this bothers her.   Water Aerobics helped.     Gait is usually fine but worse when tired - then she has a mild right foot drop and more spasticity in that leg than the left.  She takes baclofen 20 mg twice daily for the spasticity.    She does ok on stairs and is using the bannister going downstairs. She has a burning pain in both feet.   No numbness.  The dysesthesias are improved with gabapenin (takes bid).  Vision is fine.  Bladder function is fine.      She has sleep maintenance insomnia.  No trouble with sleep .  She does not have nocturia, RLS or snoring.   She notes brain fog some days.   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   This was treated with gamma knife in 2013 (Dr. Angelyn Punt).   She has a mild hearing loss as sequela.      MS History:    She was diagnosed with multiple sclerosis in the 1997 after she presented with numbness in both arms. An MRI of the cervical spine showed a plaque at C2C3.   An MRI of the brain at that time showed other lesions, compatible with the  diagnosis of multiple sclerosis. She had only 3 plaques in the brain initially and treatment was deferred. However, one year later she had optic neuritis, confirmed the diagnosis of multiple sclerosis. Initially, she was placed on Avonex. She then tried Copaxonelater on as she was having flulike symptoms with Avonex. However, she had severe skin reactions with Copaxone that was stopped. She then went on Rebif. She has been on Rebif for the past 8 years or so and tolerates it very well. Her MS has been mostly stable while on the Rebif. Last exacerbation was 2015.   Her Rebif dose is 22 g. She tolerates that dose well.    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  She sees at [facility] for this. Her last MRI 10/2015 was stable.   She has had gamma knife.   Imaging: MRI of the brain in 2015 did not show any new lesions.  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 On the cervical spine MRI she has 3 hyperintense foci consistent with demyelinating plaque.  MRI brain 07/14/2015 showed similar appearance of 10 x 7 mm enhancing extra-axial mass along the left tentorium and projecting infratentorially compared to the scan from 07/16/2013. No new enhancing masses are seen. No significant white matter disease.    11/05/2016 cervical spine MRI shows two T2 hyperintense foci within the spinal cord, anteriorly just below the cervicomedullary junction and posteriorly adjacent to C2-C3.   Neither of these appear to be acute or enhance after contrast is administered.    Moderate degenerative changes causing mild spinal stenosis at C3-C4, C4-C5, C5-C6 and C6-C7. There does not appear to be any nerve root compression.  There is a normal enhancement pattern and there are no acute findings.  11/05/2016 MRI of the thoracic spine showed a normal spinal cord and some mild degenerative changes without  nerve root compression or spinal stenosis.  MRI brain 08/22/2020 showed scattered T2/FLAIR hyperintense foci in the hemispheres and 2 FLAIR hyperintense foci in the spinal cord.  None of these enhance or appear to be acute.  They were present on previous MRIs from 2016 and 2018.  Although the brain foci are very nonspecific and could represent chronic   microvascular ischemic change or demyelination, the 2 lesions in the upper cervical spine are consistent with the clinical diagnosis of multiple sclerosis. Also,    10 x 8 mm meningioma at the left cerebellopontine angle unchanged in appearance compared to the 07/14/2015 MRI  MRI  brain 12/09/2022 showed T2 hyperintense foci within the spinal cord, right cerebral peduncle and cerebral hemispheres.  Although some of the foci are nonspecific, the overall pattern is consistent with chronic demyelination associated with multiple sclerosis.  There could be minimal superimposed chronic microvascular ischemic changes.  None of the foci appear to be acute.  They do not enhance.  Compared to the MRI from 2021, there are no new lesions.   10 x 7 mm enhancing dural based left tentorial focus consistent with a meningioma, unchanged compared to the 2021 MR  REVIEW OF SYSTEMS: Constitutional: No fevers, chills, sweats, or change in appetite.   She has fatigue and insomnia.   Eyes: No visual changes, double vision, eye pain Ear, nose and throat: No hearing loss, ear pain, nasal congestion, sore throat Cardiovascular: No chest pain, palpitations Respiratory:  No shortness of breath at rest or with exertion.   No wheezes GastrointestinaI: No nausea, vomiting, diarrhea, abdominal pain, fecal incontinence Genitourinary:  No dysuria, urinary retention or frequency.  No nocturia. Musculoskeletal:  No neck pain, back pain Integumentary: No rash, pruritus, skin lesions Neurological: as above Psychiatric: No current depression at this time.  No anxiety Endocrine: No  palpitations, diaphoresis, change in appetite, change in weigh or increased thirst Hematologic/Lymphatic:  No anemia, purpura, petechiae. Allergic/Immunologic: No itchy/runny eyes, nasal congestion, recent allergic reactions, rashes  ALLERGIES: Allergies  Allergen Reactions   Doxycycline Other (See Comments) and Rash    headaches headaches    Hydrocodone-Acetaminophen Nausea And Vomiting   Oxycodone Nausea And Vomiting   Propoxyphene Nausea And Vomiting   Tramadol Nausea And Vomiting   Lamotrigine Rash    HOME MEDICATIONS:  Current Outpatient Medications:    ARNUITY ELLIPTA 100 MCG/ACT AEPB, Inhale 100 mcg into the lungs., Disp: , Rfl:    baclofen (LIORESAL) 10 MG tablet, TAKE 1 TABLET FOUR TIMES DAILY, Disp: 360 tablet, Rfl: 0   buPROPion (WELLBUTRIN XL) 300 MG 24 hr tablet, TAKE 1 TABLET EVERY DAY (Patient taking differently: Take 300 mg by mouth daily.), Disp: 90 tablet,  Rfl: 1   gabapentin (NEURONTIN) 600 MG tablet, TAKE 1 TABLET FOUR TIMES DAILY AS NEEDED, Disp: 360 tablet, Rfl: 3   lisinopril-hydrochlorothiazide (ZESTORETIC) 10-12.5 MG tablet, Take 1 tablet by mouth daily., Disp: , Rfl:    QVAR REDIHALER 80 MCG/ACT inhaler, , Disp: , Rfl:    sulfamethoxazole-trimethoprim (BACTRIM DS) 800-160 MG tablet, Take 1 tablet by mouth as needed. Take as needed only, Disp: , Rfl:    carisoprodol (SOMA) 350 MG tablet, Take 1 tablet (350 mg total) by mouth 3 (three) times daily as needed. for muscle spams, Disp: 90 tablet, Rfl: 3   diltiazem (CARDIZEM CD) 120 MG 24 hr capsule, Take by mouth., Disp: , Rfl:    metroNIDAZOLE (METROGEL) 1 % gel, Apply 1 application topically daily.  (Patient not taking: Reported on 11/24/2023), Disp: , Rfl:   PAST MEDICAL HISTORY: Past Medical History:  Diagnosis Date   Benign brain tumor (HCC)    Chronic sinusitis    Hearing loss    Hypertension    Multiple sclerosis (HCC)    Raynaud disease    Vision abnormalities     PAST SURGICAL HISTORY: Past  Surgical History:  Procedure Laterality Date   BRAIN TUMOR EXCISION     hystrectomy     NASAL SINUS SURGERY      FAMILY HISTORY: Family History  Problem Relation Age of Onset   Healthy Mother    Lung cancer Father     SOCIAL HISTORY:  Social History   Socioeconomic History   Marital status: Married    Spouse name: Not on file   Number of children: Not on file   Years of education: Not on file   Highest education level: Not on file  Occupational History   Not on file  Tobacco Use   Smoking status: Never   Smokeless tobacco: Never  Substance and Sexual Activity   Alcohol use: No    Alcohol/week: 0.0 standard drinks of alcohol   Drug use: No   Sexual activity: Not on file  Other Topics Concern   Not on file  Social History Narrative   Not on file   Social Drivers of Health   Financial Resource Strain: Low Risk  (05/12/2023)   Received from [facility]   Overall Financial Resource Strain (CARDIA)    Difficulty of Paying Living Expenses: Not hard at all  Food Insecurity: Low Risk  (06/10/2023)   Received from [facility]   Hunger Vital Sign    Worried About Running Out of Food in the Last Year: Never true    Ran Out of Food in the Last Year: Never true  Transportation Needs: No Transportation Needs (06/10/2023)   Received from [facility]    In the past 12 months, has lack of reliable transportation kept you from medical appointments, meetings, work or from getting things needed for daily living? : No  Physical Activity: Insufficiently Active (05/12/2023)   Received from [facility]   Exercise Vital Sign    Days of Exercise per Week: 2 days    Minutes of Exercise per Session: 30 min  Stress: No Stress Concern Present (05/12/2023)   Received from [facility] - Occupational Stress Questionnaire    Feeling of Stress : Not at all  Social Connections: Socially Integrated (05/12/2023)   Received  from [facility]   Social Network    How would you rate your social network (family, work, friends)?: Good  participation with social networks  Intimate Partner Violence: Not At Risk (05/12/2023)   Received from [facility]   HITS    Over the last 12 months how often did your partner physically hurt you?: Never    Over the last 12 months how often did your partner insult you or talk down to you?: Never    Over the last 12 months how often did your partner threaten you with physical harm?: Never    Over the last 12 months how often did your partner scream or curse at you?: Never     PHYSICAL EXAM  Vitals:   11/24/23 1249  BP: 96/67  Pulse: 83  Weight: 147 lb (66.7 kg)  Height: 5\' 6"  (1.676 m)    Body mass index is 23.73 kg/m.   General: The patient is well-developed and well-nourished and in no acute distress  Skin/Ext: Extremities are without rash or edema.   She is moderately tender over the left piriformis muscle and mildly tender over the right.  No tenderness over the lumbar paraspinal muscles or the trochanteric bursae.Marland Kitchen    Neurologic Exam  Mental status: The patient is alert and oriented x 3 at the time of the examination. The patient has apparent normal recent and remote memory, with an apparently normal attention span and concentration ability.   Speech is normal.  Cranial nerves: Extraocular movements are full.  Facial strength and sensation was normal.  Trapezius strength was normal..   Hearing appears to be normal.  Motor: Muscle bulk, tone and strength are normal.  Sensory: She had intact sensation to touch and vibration in the arms and legs.  Gait and station: Station is normal.  The gait is wide.  She has a right foot drop.  The tandem gait is poor.  Romberg is negative   Deep tendon reflexes: reflexes were 2 and symmetric in the arms, 3 at the knees and 2 at the ankles.               ASSESSMENT AND PLAN  Multiple sclerosis (HCC)  Meningioma  (HCC)  Dysesthesia  Piriformis syndrome, left  Right foot drop   1.  She will remain off of a disease modifying therapy.  Last MRI showed stability from 2021 to 2024 2.   Continue baclofen for spasticity and gabapentin for dysesthesias.    3.   Stay active and exercise as tolerated.   4.   She will return to see me in 6 months or sooner if she has new or worsening neurologic symptom  This visit is part of a comprehensive longitudinal care medical relationship regarding the patients primary diagnosis of MS and related concerns.   Wyndham Santilli A. Epimenio Foot, MD, PhD 11/24/2023, 1:20 PM Certified in Neurology, Clinical Neurophysiology, Sleep Medicine, Pain Medicine and Neuroimaging  [facility] [address], [address] [place], [place] [address] [phone]

## 2023-11-29 ENCOUNTER — Encounter: Payer: Self-pay | Admitting: Neurology

## 2023-11-29 LAB — MULTIPLE MYELOMA PANEL, SERUM
Albumin SerPl Elph-Mcnc: 3.9 g/dL (ref 2.9–4.4)
Albumin/Glob SerPl: 1.2 (ref 0.7–1.7)
Alpha 1: 0.3 g/dL (ref 0.0–0.4)
Alpha2 Glob SerPl Elph-Mcnc: 0.7 g/dL (ref 0.4–1.0)
B-Globulin SerPl Elph-Mcnc: 0.9 g/dL (ref 0.7–1.3)
Gamma Glob SerPl Elph-Mcnc: 1.3 g/dL (ref 0.4–1.8)
Globulin, Total: 3.3 g/dL (ref 2.2–3.9)
IgA/Immunoglobulin A, Serum: 147 mg/dL (ref 64–422)
IgG (Immunoglobin G), Serum: 1498 mg/dL (ref 586–1602)
IgM (Immunoglobulin M), Srm: 101 mg/dL (ref 26–217)
Total Protein: 7.2 g/dL (ref 6.0–8.5)

## 2023-11-29 LAB — VITAMIN B12: Vitamin B-12: >2000 pg/mL — ABNORMAL HIGH (ref 232–1245)

## 2023-11-29 LAB — ANA+ENA+DNA/DS+SCL 70+SJOSSA/B
ANA Titer 1: NEGATIVE
ENA RNP Ab: <0.2 AI (ref 0.0–0.9)
ENA SM Ab Ser-aCnc: <0.2 AI (ref 0.0–0.9)
ENA SSA (RO) Ab: <0.2 AI (ref 0.0–0.9)
ENA SSB (LA) Ab: <0.2 AI (ref 0.0–0.9)
Scleroderma (Scl-70) (ENA) Antibody, IgG: <0.2 AI (ref 0.0–0.9)
dsDNA Ab: <1 [IU]/mL (ref 0–9)

## 2024-03-19 ENCOUNTER — Other Ambulatory Visit: Payer: Self-pay | Admitting: Neurology

## 2024-04-02 ENCOUNTER — Telehealth: Payer: Self-pay | Admitting: Neurology

## 2024-04-02 ENCOUNTER — Encounter: Payer: Self-pay | Admitting: *Deleted

## 2024-04-02 NOTE — Telephone Encounter (Signed)
 MD signed letter, I placed in mail to pt.

## 2024-04-02 NOTE — Telephone Encounter (Addendum)
 Spoke w. Dr. Vear who agreed to write letter. Letter written, printed and pending MD signature.   I called pt at [phone]. She would like letter mailed. Confirmed mailing address on file correct.

## 2024-04-02 NOTE — Telephone Encounter (Signed)
 Pt states she has paid for personal training sessions , she had an episode where her legs gave out on her.  She is now asking if because of her MS if Dr Vear will prepare a letter that she isnt to return to personal training sessions until Sept.  Pt feels that by then the weather will have cooled off and will permit her to return.

## 2024-04-02 NOTE — Telephone Encounter (Signed)
 Asking MD to see if he will approve writing letter

## 2024-05-15 ENCOUNTER — Other Ambulatory Visit: Payer: Self-pay | Admitting: Neurology

## 2024-05-15 ENCOUNTER — Other Ambulatory Visit: Payer: Self-pay

## 2024-05-16 NOTE — Telephone Encounter (Signed)
 Last seen on 11/24/23 per note  Continue baclofen  for spasticity  Follow up scheduled on 09/02/23

## 2024-05-23 ENCOUNTER — Telehealth: Payer: Self-pay | Admitting: Neurology

## 2024-05-23 NOTE — Telephone Encounter (Signed)
 Talked with Dr. Chalice and correct order should be solumedrol 1000mg x3 days. I called pt. Relayed Dr. Cleatrice recommendations. She will come tomorrow at 11am. Friday at 8am. Monday at 10:30am. Gave signed order below to [facility]. Pt preferred to not do oral steroids after IV. Dr. Chalice approved and stated they were not necessary.

## 2024-05-23 NOTE — Telephone Encounter (Signed)
 Dr. Chalice- what would you recommend? I spoke with [facility] and last time pt received IV steroids here was 10/15/2016, 1Gx3days  Last saw Dr. Vear 11/24/23. Next follow up scheduled for 07/02/24.    I called pt at [phone]. At first, she thought she was having stroke and went to hospital last night. They r/o this and that is why she f/u with cardiology today (can be viewed in care everywhere) who states sx unrelated to Afib and to contact Neurologist. Feels it is r/t to MS. Creatinine level has been increasing over time per pt. Has constant swelling in L leg. Feels Afib cause of this. Told to use compression stockings/continue diruetic.  She is taking gabapentin  600mg  po BID and baclofen  20mg  po BID.  Denies any current infection/illness. No UTI currently.   Numb/ting constant. States Dr. Vear typically would order IV steroidx1 day in the past here at our office with [facility]. Feels last dose about 3 yr ago. Denies any personal hx Diabetes.   Relayed Dr. Vear out of office until next week but will send to covering doctor, Dr. Chalice to review and make recommendation. Aware we will call back with her response.

## 2024-05-23 NOTE — Telephone Encounter (Signed)
 Pt is asking for a call from RN to discuss Tingling in arms and legs, she has had this for over a week,please call.

## 2024-05-23 NOTE — Telephone Encounter (Signed)
 Dr. Chalice- looks like she was just dx with Afib yesterday per notes in EPIC. Ok to still offer IV steroid/oral taper?

## 2024-05-23 NOTE — Telephone Encounter (Signed)
 In office infusion with opportunity to take vital signs, yes.

## 2024-05-30 ENCOUNTER — Telehealth: Payer: Self-pay | Admitting: Neurology

## 2024-05-30 NOTE — Telephone Encounter (Signed)
 Pt called wanting to inform the provider that she will be having an ablation at [facility] and she is wanting to know if that will be ok since she was just treated for an exacerbation. Please advise.

## 2024-05-30 NOTE — Telephone Encounter (Signed)
 Dr. Vear- see phone encounter from 05/23/24 while you were out. Ok for her to proceed as planned?

## 2024-06-13 ENCOUNTER — Other Ambulatory Visit: Payer: Self-pay | Admitting: Neurology

## 2024-06-13 NOTE — Telephone Encounter (Signed)
 Last seen on 11/24/23 Follow up scheduled on 07/02/24   Dispensed Days Supply Quantity Provider Pharmacy  bupropion  HCl XL 300 mg 24 hr tablet, extended release 05/26/2024 90 90 tablet Sater, Charlie LABOR, MD [facility] Mail D.[email or web address]

## 2024-07-02 ENCOUNTER — Encounter: Payer: Self-pay | Admitting: Neurology

## 2024-07-02 ENCOUNTER — Ambulatory Visit: Admitting: Neurology

## 2024-07-02 VITALS — BP 136/88 | HR 93 | Resp 16 | Ht 66.0 in | Wt 151.0 lb

## 2024-07-02 DIAGNOSIS — G5702 Lesion of sciatic nerve, left lower limb: Secondary | ICD-10-CM

## 2024-07-02 DIAGNOSIS — N183 Chronic kidney disease, stage 3 unspecified: Secondary | ICD-10-CM

## 2024-07-02 DIAGNOSIS — D329 Benign neoplasm of meninges, unspecified: Secondary | ICD-10-CM

## 2024-07-02 DIAGNOSIS — B0229 Other postherpetic nervous system involvement: Secondary | ICD-10-CM | POA: Diagnosis not present

## 2024-07-02 DIAGNOSIS — R208 Other disturbances of skin sensation: Secondary | ICD-10-CM

## 2024-07-02 DIAGNOSIS — G35D Multiple sclerosis, unspecified: Secondary | ICD-10-CM

## 2024-07-02 MED ORDER — CARISOPRODOL 350 MG PO TABS: 350.0000 mg | ORAL_TABLET | Freq: Three times a day (TID) | ORAL | 3 refills | Status: AC | PRN

## 2024-07-02 NOTE — Progress Notes (Signed)
 [facility]  PATIENT: Dorothy Meyer DOB: 03-15-1951  REFERRING DOCTOR OR PCP:  Lamar Songster  SOURCE: Patient  _________________________________   HISTORICAL  CHIEF COMPLAINT:  Chief Complaint  Patient presents with   Multiple Sclerosis    Rm10, alone, Ms: pt stated that since we last saw her has had extreme fatigue, heart issues, exacerbation 2 weeks ago and had an infusion    HISTORY OF PRESENT ILLNESS:  Dorothy Meyer is a 73 y.o. woman with MS.    She was diagnosed with relapsing remitting MS in 1996.    Update 07/02/2024: She stopped Rebif  in 2022.  She has not had any exacerbation..    Had TMJ MRi but brain not well covered.   Last MRi for brain was 2021 and showed no new lesions.     She had shingles in 2021 (left S3) shingle causing groin pain and now is on gabapentin  600 mg po tid and nortriptyline 25 mg po qHS.  This affects sleep.  Since nortriptyline was started the pain is better.   Also sleeping better.  She continues to have a muscle stretching sensation in her back unclear if musculoskeletal or related to her shingles.   Gait has a mild right foot drop and more spasticity in that leg than the left.  She takes baclofen  20 mg twice daily for the spasticity.    She does ok on stairs and is using the bannister going downstairs. She has a burning pain in both feet.   No numbness.  The dysesthesias are improved with gabapenin (takes bid).  Vision is fine.  Bladder function is fine.      She has sleep maintenance insomnia.  No trouble with sleep .  She does not have nocturia, RLS or snoring.   She notes brain fog some days.   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   This was treated with gamma knife in 2013 (Dr. Nancye).   She has a mild hearing loss as sequela.     She recently was found to have AFib.  She was placed Eliquis and had a failed electroconversion followed by EP and ablation.    Her creatinine is elevated   06/19/24 (GFR 37) and has an appt t see Renal.  We discussed baclofen  and gabapentin  are renally excreted and due to kidney issues, reduce dose to bid.     MS History:    She was diagnosed with multiple sclerosis in the 1997 after she presented with numbness in both arms. An MRI of the cervical spine showed a plaque at C2C3.   An MRI of the brain at that time showed other lesions, compatible with the diagnosis of multiple sclerosis. She had only 3 plaques in the brain initially and treatment was deferred. However, one year later she had optic neuritis, confirmed the diagnosis of multiple sclerosis. Initially, she was placed on Avonex. She then tried Copaxonelater on as she was having flulike symptoms with Avonex. However, she had severe skin reactions with Copaxone that was stopped. She then went on Rebif . She has been on Rebif  for the past 8 years or so and tolerates it very well. Her MS has been mostly stable while on the Rebif . Last exacerbation was 2015.   Her Rebif  dose is 22 g. She tolerates that dose well.    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  She sees at [facility] for this. Her last MRI 10/2015 was stable.   She has had gamma knife.   Imaging: MRI of the brain in 2015 did not show any new lesions.  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 On the cervical spine MRI she has 3 hyperintense foci consistent with demyelinating plaque.  MRI brain 07/14/2015 showed similar appearance of 10 x 7 mm enhancing extra-axial mass along the left tentorium and projecting infratentorially compared to the scan from 07/16/2013. No new enhancing masses are seen. No significant white matter disease.    11/05/2016 cervical spine MRI shows two T2 hyperintense foci within the spinal cord, anteriorly just below the cervicomedullary junction and  posteriorly adjacent to C2-C3.   Neither of these appear to be acute or enhance after contrast is administered.    Moderate degenerative changes causing mild spinal stenosis at C3-C4, C4-C5, C5-C6 and C6-C7. There does not appear to be any nerve root compression.  There is a normal enhancement pattern and there are no acute findings.  11/05/2016 MRI of the thoracic spine showed a normal spinal cord and some mild degenerative changes without nerve root compression or spinal stenosis.  MRI brain 08/22/2020 showed scattered T2/FLAIR hyperintense foci in the hemispheres and 2 FLAIR hyperintense foci in the spinal cord.  None of these enhance or appear to be acute.  They were present on previous MRIs from 2016 and 2018.  Although the brain foci are very nonspecific and could represent chronic   microvascular ischemic change or demyelination, the 2 lesions in the upper cervical spine are consistent with the clinical diagnosis of multiple sclerosis. Also,    10 x 8 mm meningioma at the left cerebellopontine angle unchanged in appearance compared to the 07/14/2015 MRI  MRI  brain 12/09/2022 showed T2 hyperintense foci within the spinal cord, right cerebral peduncle and cerebral hemispheres.  Although some of the foci are nonspecific, the overall pattern is consistent with chronic demyelination associated with multiple sclerosis.  There could be minimal superimposed chronic microvascular ischemic changes.  None of the foci appear to be acute.  They do not enhance.  Compared to the MRI from 2021, there are no new lesions.   10 x 7 mm enhancing dural based left tentorial focus consistent with a meningioma, unchanged compared to the 2021 MR  REVIEW OF SYSTEMS: Constitutional: No fevers, chills, sweats, or change in appetite.   She has fatigue and insomnia.   Eyes: No visual changes, double vision, eye pain Ear, nose and throat: No hearing loss, ear pain, nasal congestion, sore throat Cardiovascular: No chest pain,  palpitations Respiratory:  No shortness of breath at rest or with exertion.   No wheezes GastrointestinaI: No nausea, vomiting, diarrhea, abdominal pain, fecal incontinence Genitourinary:  No dysuria, urinary retention or frequency.  No nocturia. Musculoskeletal:  No neck pain, back pain Integumentary: No rash, pruritus, skin lesions Neurological: as above Psychiatric: No current depression at this time.  No anxiety Endocrine: No palpitations, diaphoresis, change in appetite, change in weigh or increased thirst Hematologic/Lymphatic:  No anemia, purpura, petechiae. Allergic/Immunologic: No itchy/runny eyes, nasal congestion, recent allergic reactions, rashes  ALLERGIES: Allergies  Allergen Reactions   Doxycycline Other (See Comments), Rash and Nausea Only    headaches   Hydrocodone-Acetaminophen Nausea Only    Other Reaction(s): vomiting   Propoxyphene Nausea And Vomiting and Nausea Only  propoxyphene hydrochloride   Tramadol Nausea And Vomiting, Nausea Only and Other (See Comments)    Headache   Lamotrigine  Rash and Dermatitis   Hydrocodone-Acetaminophen Nausea And Vomiting   Oxycodone Nausea And Vomiting and Nausea Only    HOME MEDICATIONS:  Current Outpatient Medications:    apixaban (ELIQUIS) 5 MG TABS tablet, Take 5 mg by mouth 2 (two) times daily., Disp: , Rfl:    aspirin EC 81 MG tablet, Take 81 mg by mouth daily., Disp: , Rfl:    nortriptyline (PAMELOR) 25 MG capsule, Take 25 mg by mouth at bedtime., Disp: , Rfl:    ARNUITY ELLIPTA 100 MCG/ACT AEPB, Inhale 100 mcg into the lungs., Disp: , Rfl:    baclofen  (LIORESAL ) 10 MG tablet, TAKE 1 TABLET FOUR TIMES DAILY, Disp: 360 tablet, Rfl: 1   buPROPion  (WELLBUTRIN  XL) 300 MG 24 hr tablet, Take 1 tablet by mouth once daily, Disp: 90 tablet, Rfl: 0   carisoprodol  (SOMA ) 350 MG tablet, Take 1 tablet (350 mg total) by mouth 3 (three) times daily as needed. for muscle spams, Disp: 90 tablet, Rfl: 3   diltiazem (CARDIZEM CD) 120  MG 24 hr capsule, Take by mouth., Disp: , Rfl:    gabapentin  (NEURONTIN ) 600 MG tablet, TAKE 1 TABLET FOUR TIMES DAILY AS NEEDED, Disp: 360 tablet, Rfl: 3   lisinopril-hydrochlorothiazide  (ZESTORETIC) 10-12.5 MG tablet, Take 1 tablet by mouth daily., Disp: , Rfl:    metroNIDAZOLE (METROGEL) 1 % gel, Apply 1 application topically daily.  (Patient not taking: Reported on 11/24/2023), Disp: , Rfl:    QVAR REDIHALER 80 MCG/ACT inhaler, , Disp: , Rfl:    sulfamethoxazole-trimethoprim (BACTRIM DS) 800-160 MG tablet, Take 1 tablet by mouth as needed. Take as needed only, Disp: , Rfl:   PAST MEDICAL HISTORY: Past Medical History:  Diagnosis Date   Benign brain tumor (HCC)    Chronic sinusitis    Hearing loss    Hypertension    Multiple sclerosis    Raynaud disease    Vision abnormalities     PAST SURGICAL HISTORY: Past Surgical History:  Procedure Laterality Date   BRAIN TUMOR EXCISION     hystrectomy     NASAL SINUS SURGERY      FAMILY HISTORY: Family History  Problem Relation Age of Onset   Healthy Mother    Lung cancer Father     SOCIAL HISTORY:  Social History   Socioeconomic History   Marital status: Married    Spouse name: Not on file   Number of children: Not on file   Years of education: Not on file   Highest education level: Not on file  Occupational History   Not on file  Tobacco Use   Smoking status: Never   Smokeless tobacco: Never  Substance and Sexual Activity   Alcohol use: No    Alcohol/week: 0.0 standard drinks of alcohol   Drug use: No   Sexual activity: Not on file  Other Topics Concern   Not on file  Social History Narrative   Not on file   Social Drivers of Health   Financial Resource Strain: Low Risk  (05/15/2024)   Received from [facility]   Overall Financial Resource Strain (CARDIA)    How hard is it for you to pay for the very basics like food, housing, medical care, and heating?: Not hard at all  Food Insecurity: Low Risk   (06/22/2024)   Received from [facility]   Hunger Vital Sign  Within the past 12 months, you worried that your food would run out before you got money to buy more: Never true    Within the past 12 months, the food you bought just didn't last and you didn't have money to get more. : Never true  Transportation Needs: No Transportation Needs (06/22/2024)   Received from [facility]    In the past 12 months, has lack of reliable transportation kept you from medical appointments, meetings, work or from getting things needed for daily living? : No  Physical Activity: Insufficiently Active (05/15/2024)   Received from [facility]   Exercise Vital Sign    On average, how many days per week do you engage in moderate to strenuous exercise (like a brisk walk)?: 2 days    On average, how many minutes do you engage in exercise at this level?: 30 min  Stress: No Stress Concern Present (06/17/2024)   Received from [facility] - Occupational Stress Questionnaire    Do you feel stress - tense, restless, nervous, or anxious, or unable to sleep at night because your mind is troubled all the time - these days?: Not at all  Social Connections: Socially Integrated (05/15/2024)   Received from [facility]   Social Network    How would you rate your social network (family, work, friends)?: Good participation with social networks  Intimate Partner Violence: Not At Risk (06/17/2024)   Received from [facility]   HITS    Over the last 12 months how often did your partner physically hurt you?: Never    Over the last 12 months how often did your partner insult you or talk down to you?: Never    Over the last 12 months how often did your partner threaten you with physical harm?: Never    Over the last 12 months how often did your partner scream or curse at you?: Never     PHYSICAL EXAM  Vitals:   07/02/24 1122  BP: 136/88  Pulse: 93   Resp: 16  SpO2: (!) 88%  Weight: 151 lb (68.5 kg)  Height: 5' 6 (1.676 m)    Body mass index is 24.37 kg/m.   General: The patient is well-developed and well-nourished and in no acute distress  Skin/Ext: Extremities are without rash or edema.       Neurologic Exam  Mental status: The patient is alert and oriented x 3 at the time of the examination. The patient has apparent normal recent and remote memory, with an apparently normal attention span and concentration ability.   Speech is normal.  Cranial nerves: Extraocular movements are full.  Facial strength and sensation was normal.  Trapezius strength was normal..   Hearing appears to be normal.  Motor: Muscle bulk, tone and strength are normal.  Sensory: She had intact sensation to touch and vibration in the arms and legs.  Gait and station: Station is normal.  The gait is wide with a right foot drop.  The tandem gait is poor.  Romberg is negative   Deep tendon reflexes: reflexes were 2 and symmetric in the arms, 3 at the knees, R>L,  and 2 at the ankles.               ASSESSMENT AND PLAN  Multiple sclerosis  Meningioma (HCC)  Dysesthesia  PHN (postherpetic neuralgia)  Piriformis syndrome, left  Chronic renal failure, stage 3 (moderate), unspecified whether stage 3a or  3b CKD (HCC)   1.  She will remain off of a disease modifying therapy.  Last MRI showed stability from 2021 to 2024 2.   She has recently been diagnosed with A-fib and is on Eliquis.  She also has had elevated creatinine with a GFR less than 40.  I discussed with her that gabapentin  and baclofen  are both renally excreted.  I would recommend that she cut the dose from 3 times daily to twice daily and I would consider reducing the dose further if the renal function worsens from here.  Renew carisoprodel. 3.   Stay active and exercise as tolerated.   4.   She will return to see me in 6 months or sooner if she has new or worsening neurologic  symptom  This visit is part of a comprehensive longitudinal care medical relationship regarding the patients primary diagnosis of MS and related concerns.   Jaser Fullen A. Vear, MD, PhD 07/02/2024, 2:40 PM Certified in Neurology, Clinical Neurophysiology, Sleep Medicine, Pain Medicine and Neuroimaging  [facility] [address] [place], [place] [address] [phone]

## 2024-07-07 ENCOUNTER — Other Ambulatory Visit: Payer: Self-pay | Admitting: Neurology

## 2024-07-11 NOTE — Telephone Encounter (Signed)
 Pt called to Follow up about medication refill ,Pharmacy informed Pt to follow up  gabapentin  (NEURONTIN ) 600 MG tablet   Pt medication is to be sent to    [id number] - [place], [place] - [address] (Ph: [phone])

## 2024-08-09 ENCOUNTER — Encounter: Payer: Self-pay | Admitting: Neurology

## 2024-08-15 ENCOUNTER — Telehealth: Payer: Self-pay | Admitting: Neurology

## 2024-08-15 NOTE — Telephone Encounter (Signed)
 Referral for physical therapy fax to [facility],. Phone: [phone], Fax: [phone]

## 2025-02-04 ENCOUNTER — Ambulatory Visit: Admitting: Neurology
